# Patient Record
Sex: Female | Born: 1935 | Race: White | Hispanic: No | Smoking: Never smoker
Health system: Southern US, Community
[De-identification: ages and names within clinical notes are randomized; demographics above are authoritative.]

## PROBLEM LIST (undated history)

## (undated) DIAGNOSIS — I38 Endocarditis, valve unspecified: Secondary | ICD-10-CM

## (undated) DIAGNOSIS — E7800 Pure hypercholesterolemia, unspecified: Secondary | ICD-10-CM

## (undated) DIAGNOSIS — C801 Malignant (primary) neoplasm, unspecified: Secondary | ICD-10-CM

## (undated) DIAGNOSIS — I509 Heart failure, unspecified: Secondary | ICD-10-CM

## (undated) DIAGNOSIS — I10 Essential (primary) hypertension: Secondary | ICD-10-CM

---

## 2004-12-01 ENCOUNTER — Ambulatory Visit: Payer: Self-pay | Admitting: Ophthalmology

## 2004-12-08 ENCOUNTER — Ambulatory Visit: Payer: Self-pay | Admitting: Ophthalmology

## 2006-12-21 ENCOUNTER — Ambulatory Visit: Payer: Self-pay | Admitting: Nurse Practitioner

## 2007-07-12 ENCOUNTER — Emergency Department: Payer: Self-pay | Admitting: Emergency Medicine

## 2008-02-05 ENCOUNTER — Ambulatory Visit: Payer: Self-pay | Admitting: Family Medicine

## 2008-10-02 ENCOUNTER — Ambulatory Visit: Payer: Self-pay | Admitting: Family Medicine

## 2009-04-30 ENCOUNTER — Ambulatory Visit: Payer: Self-pay | Admitting: Family Medicine

## 2009-05-14 ENCOUNTER — Ambulatory Visit: Payer: Self-pay | Admitting: Family Medicine

## 2009-05-19 ENCOUNTER — Ambulatory Visit: Payer: Self-pay | Admitting: Oncology

## 2009-06-03 ENCOUNTER — Ambulatory Visit: Payer: Self-pay | Admitting: Oncology

## 2009-06-12 ENCOUNTER — Ambulatory Visit: Payer: Self-pay | Admitting: Family Medicine

## 2009-06-18 ENCOUNTER — Ambulatory Visit: Payer: Self-pay | Admitting: Cardiothoracic Surgery

## 2009-06-24 ENCOUNTER — Ambulatory Visit: Payer: Self-pay | Admitting: Oncology

## 2009-07-08 ENCOUNTER — Ambulatory Visit: Payer: Self-pay | Admitting: Oncology

## 2009-07-19 ENCOUNTER — Ambulatory Visit: Payer: Self-pay | Admitting: Oncology

## 2009-07-19 ENCOUNTER — Ambulatory Visit: Payer: Self-pay | Admitting: Cardiothoracic Surgery

## 2009-09-18 ENCOUNTER — Ambulatory Visit: Payer: Self-pay | Admitting: Oncology

## 2009-10-07 ENCOUNTER — Ambulatory Visit: Payer: Self-pay | Admitting: Oncology

## 2009-10-09 ENCOUNTER — Ambulatory Visit: Payer: Self-pay | Admitting: Oncology

## 2009-10-19 ENCOUNTER — Ambulatory Visit: Payer: Self-pay | Admitting: Oncology

## 2009-10-29 ENCOUNTER — Ambulatory Visit: Payer: Self-pay | Admitting: Oncology

## 2010-04-18 ENCOUNTER — Ambulatory Visit: Payer: Self-pay | Admitting: Oncology

## 2010-04-23 ENCOUNTER — Ambulatory Visit: Payer: Self-pay | Admitting: Oncology

## 2010-05-19 ENCOUNTER — Ambulatory Visit: Payer: Self-pay | Admitting: Oncology

## 2010-10-25 ENCOUNTER — Ambulatory Visit: Payer: Self-pay | Admitting: Oncology

## 2010-10-26 ENCOUNTER — Ambulatory Visit: Payer: Self-pay | Admitting: Oncology

## 2010-11-18 ENCOUNTER — Ambulatory Visit: Payer: Self-pay | Admitting: Oncology

## 2011-08-08 ENCOUNTER — Emergency Department: Payer: Self-pay | Admitting: Emergency Medicine

## 2014-07-15 ENCOUNTER — Emergency Department: Payer: Self-pay | Admitting: Emergency Medicine

## 2014-07-15 LAB — CBC WITH DIFFERENTIAL/PLATELET
BASOS PCT: 0.8 %
Basophil #: 0 10*3/uL (ref 0.0–0.1)
EOS ABS: 0.2 10*3/uL (ref 0.0–0.7)
Eosinophil %: 4.3 %
HCT: 39.9 % (ref 35.0–47.0)
HGB: 13.2 g/dL (ref 12.0–16.0)
LYMPHS ABS: 1 10*3/uL (ref 1.0–3.6)
LYMPHS PCT: 27 %
MCH: 33.8 pg (ref 26.0–34.0)
MCHC: 33 g/dL (ref 32.0–36.0)
MCV: 103 fL — AB (ref 80–100)
Monocyte #: 0.6 x10 3/mm (ref 0.2–0.9)
Monocyte %: 16 %
NEUTROS PCT: 51.9 %
Neutrophil #: 1.9 10*3/uL (ref 1.4–6.5)
PLATELETS: 185 10*3/uL (ref 150–440)
RBC: 3.89 10*6/uL (ref 3.80–5.20)
RDW: 13.4 % (ref 11.5–14.5)
WBC: 3.8 10*3/uL (ref 3.6–11.0)

## 2014-07-15 LAB — COMPREHENSIVE METABOLIC PANEL
ALBUMIN: 4.1 g/dL (ref 3.4–5.0)
ANION GAP: 8 (ref 7–16)
Alkaline Phosphatase: 85 U/L
BUN: 17 mg/dL (ref 7–18)
Bilirubin,Total: 0.5 mg/dL (ref 0.2–1.0)
CHLORIDE: 101 mmol/L (ref 98–107)
CO2: 24 mmol/L (ref 21–32)
CREATININE: 1.13 mg/dL (ref 0.60–1.30)
Calcium, Total: 9.3 mg/dL (ref 8.5–10.1)
EGFR (Non-African Amer.): 43 — ABNORMAL LOW
GFR CALC AF AMER: 50 — AB
Glucose: 103 mg/dL — ABNORMAL HIGH (ref 65–99)
Osmolality: 268 (ref 275–301)
Potassium: 4.3 mmol/L (ref 3.5–5.1)
SGOT(AST): 28 U/L (ref 15–37)
SGPT (ALT): 24 U/L
Sodium: 133 mmol/L — ABNORMAL LOW (ref 136–145)
Total Protein: 7.2 g/dL (ref 6.4–8.2)

## 2014-11-25 DIAGNOSIS — I119 Hypertensive heart disease without heart failure: Secondary | ICD-10-CM | POA: Insufficient documentation

## 2014-11-25 DIAGNOSIS — I340 Nonrheumatic mitral (valve) insufficiency: Secondary | ICD-10-CM | POA: Insufficient documentation

## 2014-12-16 DIAGNOSIS — I350 Nonrheumatic aortic (valve) stenosis: Secondary | ICD-10-CM | POA: Insufficient documentation

## 2014-12-16 DIAGNOSIS — I071 Rheumatic tricuspid insufficiency: Secondary | ICD-10-CM | POA: Insufficient documentation

## 2015-01-05 ENCOUNTER — Inpatient Hospital Stay: Payer: Self-pay | Admitting: Surgery

## 2015-01-05 LAB — CBC WITH DIFFERENTIAL/PLATELET
BASOS ABS: 0.1 10*3/uL (ref 0.0–0.1)
Basophil %: 0.6 %
EOS ABS: 0 10*3/uL (ref 0.0–0.7)
Eosinophil %: 0.1 %
HCT: 47.2 % — ABNORMAL HIGH (ref 35.0–47.0)
HGB: 15.3 g/dL (ref 12.0–16.0)
Lymphocyte #: 0.6 10*3/uL — ABNORMAL LOW (ref 1.0–3.6)
Lymphocyte %: 4.1 %
MCH: 33.1 pg (ref 26.0–34.0)
MCHC: 32.4 g/dL (ref 32.0–36.0)
MCV: 102 fL — ABNORMAL HIGH (ref 80–100)
Monocyte #: 0.5 x10 3/mm (ref 0.2–0.9)
Monocyte %: 3.8 %
NEUTROS ABS: 12.6 10*3/uL — AB (ref 1.4–6.5)
Neutrophil %: 91.4 %
PLATELETS: 227 10*3/uL (ref 150–440)
RBC: 4.62 10*6/uL (ref 3.80–5.20)
RDW: 13.8 % (ref 11.5–14.5)
WBC: 13.8 10*3/uL — AB (ref 3.6–11.0)

## 2015-01-05 LAB — COMPREHENSIVE METABOLIC PANEL
ALK PHOS: 87 U/L
ANION GAP: 8 (ref 7–16)
Albumin: 4 g/dL (ref 3.4–5.0)
BILIRUBIN TOTAL: 0.4 mg/dL (ref 0.2–1.0)
BUN: 17 mg/dL (ref 7–18)
CALCIUM: 9.7 mg/dL (ref 8.5–10.1)
CREATININE: 0.93 mg/dL (ref 0.60–1.30)
Chloride: 100 mmol/L (ref 98–107)
Co2: 27 mmol/L (ref 21–32)
EGFR (African American): >60
EGFR (Non-African Amer.): >60
GLUCOSE: 247 mg/dL — AB (ref 65–99)
OSMOLALITY: 280 (ref 275–301)
POTASSIUM: 4.3 mmol/L (ref 3.5–5.1)
SGOT(AST): 24 U/L (ref 15–37)
SGPT (ALT): 21 U/L
SODIUM: 135 mmol/L — AB (ref 136–145)
TOTAL PROTEIN: 7.5 g/dL (ref 6.4–8.2)

## 2015-01-05 LAB — URINALYSIS, COMPLETE
BACTERIA: NONE SEEN
Bilirubin,UR: NEGATIVE
Blood: NEGATIVE
Hyaline Cast: 20
LEUKOCYTE ESTERASE: NEGATIVE
NITRITE: NEGATIVE
Ph: 5 (ref 4.5–8.0)
Protein: 100
SPECIFIC GRAVITY: 1.023 (ref 1.003–1.030)
Squamous Epithelial: NONE SEEN

## 2015-01-05 LAB — LIPASE, BLOOD: Lipase: 65 U/L — ABNORMAL LOW (ref 73–393)

## 2015-01-06 LAB — COMPREHENSIVE METABOLIC PANEL
ALBUMIN: 3.1 g/dL — AB (ref 3.4–5.0)
ALK PHOS: 66 U/L
ALT: 16 U/L
Anion Gap: 5 — ABNORMAL LOW (ref 7–16)
BILIRUBIN TOTAL: 0.4 mg/dL (ref 0.2–1.0)
BUN: 20 mg/dL — AB (ref 7–18)
Calcium, Total: 8.4 mg/dL — ABNORMAL LOW (ref 8.5–10.1)
Chloride: 105 mmol/L (ref 98–107)
Co2: 29 mmol/L (ref 21–32)
Creatinine: 0.99 mg/dL (ref 0.60–1.30)
EGFR (African American): >60
EGFR (Non-African Amer.): 56 — ABNORMAL LOW
Glucose: 167 mg/dL — ABNORMAL HIGH (ref 65–99)
OSMOLALITY: 284 (ref 275–301)
Potassium: 4.7 mmol/L (ref 3.5–5.1)
SGOT(AST): 18 U/L (ref 15–37)
SODIUM: 139 mmol/L (ref 136–145)
TOTAL PROTEIN: 6.1 g/dL — AB (ref 6.4–8.2)

## 2015-01-06 LAB — PROTIME-INR
INR: 1.1
Prothrombin Time: 14.4 secs (ref 11.5–14.7)

## 2015-01-06 LAB — LIPASE, BLOOD: LIPASE: 40 U/L — AB (ref 73–393)

## 2015-01-06 LAB — AMYLASE: Amylase: 32 U/L (ref 25–115)

## 2015-01-06 LAB — APTT: Activated PTT: 32.6 secs (ref 23.6–35.9)

## 2015-01-06 LAB — CBC WITH DIFFERENTIAL/PLATELET
Bands: 8 %
HCT: 45.3 % (ref 35.0–47.0)
HGB: 15.1 g/dL (ref 12.0–16.0)
LYMPHS PCT: 11 %
MCH: 33.9 pg (ref 26.0–34.0)
MCHC: 33.2 g/dL (ref 32.0–36.0)
MCV: 102 fL — ABNORMAL HIGH (ref 80–100)
Monocytes: 12 %
PLATELETS: 213 10*3/uL (ref 150–440)
RBC: 4.44 10*6/uL (ref 3.80–5.20)
RDW: 14 % (ref 11.5–14.5)
Segmented Neutrophils: 69 %
WBC: 8.2 10*3/uL (ref 3.6–11.0)

## 2015-01-06 LAB — MAGNESIUM: MAGNESIUM: 2.2 mg/dL

## 2015-01-10 LAB — COMPREHENSIVE METABOLIC PANEL
ALT: 18 U/L
ANION GAP: 6 — AB (ref 7–16)
Albumin: 2.6 g/dL — ABNORMAL LOW (ref 3.4–5.0)
Alkaline Phosphatase: 54 U/L
BILIRUBIN TOTAL: 0.6 mg/dL (ref 0.2–1.0)
BUN: 7 mg/dL (ref 7–18)
CALCIUM: 8.4 mg/dL — AB (ref 8.5–10.1)
CO2: 36 mmol/L — AB (ref 21–32)
CREATININE: 0.57 mg/dL — AB (ref 0.60–1.30)
Chloride: 97 mmol/L — ABNORMAL LOW (ref 98–107)
EGFR (African American): >60
GLUCOSE: 116 mg/dL — AB (ref 65–99)
OSMOLALITY: 276 (ref 275–301)
Potassium: 3.6 mmol/L (ref 3.5–5.1)
SGOT(AST): 23 U/L (ref 15–37)
SODIUM: 139 mmol/L (ref 136–145)
Total Protein: 5.7 g/dL — ABNORMAL LOW (ref 6.4–8.2)

## 2015-01-10 LAB — CBC WITH DIFFERENTIAL/PLATELET
BASOS PCT: 1.4 %
Basophil #: 0.1 10*3/uL (ref 0.0–0.1)
EOS ABS: 0.1 10*3/uL (ref 0.0–0.7)
Eosinophil %: 1.9 %
HCT: 36.3 % (ref 35.0–47.0)
HGB: 11.9 g/dL — AB (ref 12.0–16.0)
LYMPHS PCT: 17.1 %
Lymphocyte #: 0.8 10*3/uL — ABNORMAL LOW (ref 1.0–3.6)
MCH: 33.5 pg (ref 26.0–34.0)
MCHC: 32.8 g/dL (ref 32.0–36.0)
MCV: 102 fL — ABNORMAL HIGH (ref 80–100)
MONOS PCT: 13.7 %
Monocyte #: 0.7 x10 3/mm (ref 0.2–0.9)
NEUTROS ABS: 3.2 10*3/uL (ref 1.4–6.5)
Neutrophil %: 65.9 %
Platelet: 138 10*3/uL — ABNORMAL LOW (ref 150–440)
RBC: 3.56 10*6/uL — AB (ref 3.80–5.20)
RDW: 13.4 % (ref 11.5–14.5)
WBC: 4.9 10*3/uL (ref 3.6–11.0)

## 2015-01-15 ENCOUNTER — Encounter: Payer: Self-pay | Admitting: Internal Medicine

## 2015-01-19 ENCOUNTER — Encounter: Payer: Self-pay | Admitting: Internal Medicine

## 2015-02-17 ENCOUNTER — Encounter
Admit: 2015-02-17 | Disposition: A | Discharge status: Home / Self Care | Payer: Self-pay | Attending: Internal Medicine | Admitting: Internal Medicine

## 2015-03-20 ENCOUNTER — Encounter
Admit: 2015-03-20 | Disposition: A | Discharge status: Home / Self Care | Payer: Self-pay | Attending: Internal Medicine | Admitting: Internal Medicine

## 2015-04-19 NOTE — Discharge Summary (Signed)
PATIENT NAME:  Rhonda Mcclure, Rhonda Mcclure MR#:  [id number] DATE OF BIRTH:  Oct 06, 1923  DATE OF ADMISSION:  01/05/2015 DATE OF DISCHARGE:  01/14/2015  DIAGNOSES: Small bowel obstruction, emphysema, chronic obstructive pulmonary disease, history of cataracts, hypertension, coronary artery disease, history of clots, history of melanoma, hyperlipidemia, hypertension and hysterectomy in the past.   CONSULTANTS: Cardiology.   PROCEDURES: Exploratory laparotomy and adhesiolysis.   HISTORY OF PRESENT ILLNESS AND HOSPITAL COURSE: This is a patient who was admitted the hospital by Dr. Marina Gravel and Dr. Leanora Cover. He underwent an exploratory laparotomy after conservative management failed to notice any progression. An adhesiolysis was performed. Postoperatively, she did well. Was advanced to a regular diet and is transferred to a skilled nursing facility, at this point, to follow up in our office in 10 days.   DISCHARGE MEDICATIONS: Are delineated in the discharge paperwork, as well as  the medical reconciliation.   DISCHARGE INSTRUCTIONS: She may shower at this point and staples will be removed in 10 days.     ____________________________ Jerrol Banana Burt Knack, MD rec:JT D: 01/14/2015 12:11:29 ET T: 01/14/2015 12:41:20 ET JOB#: [id number]  cc: Jerrol Banana. Burt Knack, MD, <Dictator> Florene Glen MD ELECTRONICALLY SIGNED 01/15/2015 9:07

## 2015-04-19 NOTE — H&P (Signed)
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 She denies fever, but has had chills today. She had some nausea, and vomitied some mucus this AM. Her last BM was Saturday, 2 days ago (normal). and she typically goes daily. SHe has taken MOM, an enema, and a glycerin suppository, without effect. She has a baseline protuberant abdomen, but she feels a little more bloated than usual.  Long discussion re: EOL wishes and she consents to dialysis, Code, inotropes, blood, surgery, ostomy, feeding tubes, and mechanical ventilation, as long as she has a reasonable chance of recovery. If not, she would not like prolonged ventilation and refuses tracheostomy.   Past History Additional ROS- no PND, DOE, wheezing, GU Sx, hematemesis, hematocheia, melena, neurologic Sx.   Code Status Full Code   Healthcare POA 2 of her 4 children   Past Med/Surgical Hx:  basal cell carcinoma of nose:   hemorroids:   HEART MURMUR:   EMPHYSEMA:   COPD:   cataracts:   hypertension:   heart disease:   blood clots:   melanoma:   Hyperlipidemia:   Hypertension:   Hysterectomy - Partial:   ALLERGIES:  PCN: Rash  Morphine: Rash  Sulfa drugs: Unknown  HOME MEDICATIONS: Medication Instructions Status  aspirin 81 mg oral tablet 1 tab(s) orally once a day (at bedtime) Active  amLODIPine 5 mg oral tablet 1 tab(s) orally once a day (in the morning) Active  losartan 100 mg oral tablet 1 tab(s) orally once a day (in the morning) Active  hydrALAZINE 25 mg oral tablet 1 tab(s) orally 3 times a day Active  enalapril 20 mg oral tablet 1 tab(s) orally 2 times a day Active  simvastatin 20 mg oral tablet 1 tab(s) orally once a day (at bedtime) Active  Vitamin D2 50,000 intl units oral capsule 1 cap(s) orally once a month Active  Qvar 40 mcg/inh inhalation aerosol 1 puff(s) inhaled 2 times a day Active   Combivent Respimat CFC free 100 mcg-20 mcg/inh inhalation aerosol 1 puff(s) inhaled 4 times a day, As Needed - for Shortness of Breath Active   Family and Social History:  Family History father died of cardiovascular disease and mother had adenocarcinoma and a Sister Mary Joseph's node, presumably due to GYN malignancy.   Social History positive  tobacco, negative ETOH, negative Illicit drugs, retired Therapist, sports, lives alone, 4 children live close by, smoked < 1 PPD from teenage years to 1989, drinks ETOH rarely   + Tobacco Prior (greater than 1 year)   Place of Living Home   Review of Systems:  Fever/Chills Yes   Cough No   Sputum No   Abdominal Pain abdominal pressure   Diarrhea No   Constipation No   Nausea/Vomiting Yes   SOB/DOE No   Chest Pain No   Dysuria No   Tolerating PT Yes   Tolerating Diet No  Nauseated  Vomiting  vomited CT contrast   Medications/Allergies Reviewed Medications/Allergies reviewed   Physical Exam:  GEN well developed, well nourished, no acute distress, elderly   HEENT pink conjunctivae, PERRL, hearing intact to voice, moist oral mucosa, Oropharynx clear   NECK supple  trachea midline   RESP normal resp effort  clear BS  no use of accessory muscles   CARD regular rate  murmur present  no thrills  no JVD  no Rub   ABD denies tenderness  soft, mildly distended, nontender, nondistended   EXTR negative cyanosis/clubbing, negative edema   SKIN normal to palpation, No ulcers, skin turgor good   NEURO cranial nerves intact, negative tremor, follows commands, motor/sensory function intact   PSYCH alert, A+O to time, place, person, good insight   Additional Comments I personally viewed the CT scan.   Lab Results: Hepatic:  18-Jan-16 12:20   Bilirubin, Total 0.4  Alkaline Phosphatase 87 (46-116 NOTE: New Reference Range 07/08/14)  SGPT (ALT) 21 (14-63 NOTE: New Reference Range 07/08/14)  SGOT (AST) 24  Total Protein, Serum 7.5   Albumin, Serum 4.0  Routine Chem:  18-Jan-16 12:20   Glucose, Serum  247  BUN 17  Creatinine (comp) 0.93  Sodium, Serum  135  Potassium, Serum 4.3  Chloride, Serum 100  CO2, Serum 27  Calcium (Total), Serum 9.7  Osmolality (calc) 280  eGFR (African American) >60  eGFR (Non-African American) >60 (eGFR values <48m/min/1.73 m2 may be an indication of chronic kidney disease (CKD). Calculated eGFR, using the MRDR Study equation, is useful in  patients with stable renal function. The eGFR calculation will not be reliable in acutely ill patients when serum creatinine is changing rapidly. It is not useful in patients on dialysis. The eGFR calculation may not be applicable to patients at the low and high extremes of body sizes, pregnant women, and vegetarians.)  Anion Gap 8  Lipase  65 (Result(s) reported on 05 Jan 2015 at 01:02PM.)  Routine UA:  18-Jan-16 13:50   Color (UA) Amber  Clarity (UA) Clear  Glucose (UA) 50 mg/dL  Bilirubin (UA) Negative  Ketones (UA) Trace  Specific Gravity (UA) 1.023  Blood (UA) Negative  pH (UA) 5.0  Protein (UA) 100 mg/dL  Nitrite (UA) Negative  Leukocyte Esterase (UA) Negative (Result(s) reported on 05 Jan 2015 at 02:17PM.)  RBC (UA) 1 /HPF  WBC (UA) 2 /HPF  Bacteria (UA) NONE SEEN  Epithelial Cells (UA) NONE SEEN  Mucous (UA) PRESENT  Hyaline Cast (UA) 20 /LPF (Result(s) reported on 05 Jan 2015 at 02:17PM.)  Routine Hem:  18-Jan-16 12:20   WBC (CBC)  13.8  RBC (CBC) 4.62  Hemoglobin (CBC) 15.3  Hematocrit (CBC)  47.2  Platelet Count (CBC) 227  MCV  102  MCH 33.1  MCHC 32.4  RDW 13.8  Neutrophil % 91.4  Lymphocyte % 4.1  Monocyte % 3.8  Eosinophil % 0.1  Basophil % 0.6  Neutrophil #  12.6  Lymphocyte #  0.6  Monocyte # 0.5  Eosinophil # 0.0  Basophil # 0.1 (Result(s) reported on 05 Jan 2015 at 12:51PM.)   Radiology Results: LabUnknown:    18-Jan-16 14:59, CT Abdomen and Pelvis With Contrast  PACS Image  CT:  CT Abdomen  and Pelvis With Contrast  REASON FOR EXAM:    (1) llq pain and abd distension; (2) llq pain and abd   distension  COMMENTS:       PROCEDURE: CT  - CT ABDOMEN / PELVIS  W  - Jan 05 2015  2:59PM     CLINICAL DATA:  Initial encounter for left lower quadrant pain with  nausea. Hysterectomy. Appendectomy. Melanoma.    EXAM:  CT ABDOMEN AND PELVIS WITH CONTRAST    TECHNIQUE:  Multidetector CT imaging of the abdomen and pelvis was performed  using the standard protocol following bolus administration of  intravenous contrast.    COMPARISON:  PET of 06/12/2009.  No prior abdominal pelvic CT.    FINDINGS:  Lower chest:  Centrilobular emphysema. Mild cardiomegaly with  coronary artery atherosclerosis. Small hiatal hernia.    Hepatobiliary: Focal steatosis adjacent the falciform ligament.  Normal gallbladder, without biliary ductal dilatation.    Pancreas: Normal pancreas for age, without ductal dilatation or  acute pancreatitis.    Spleen: Calcifications within suggest old granulomatous disease.  Adrenals/Urinary Tract: Normal adrenal glands. Upper pole left renal  lesion is too small to characterize but likely a cyst. Normal right  kidney, without hydronephrosis. Decompressed urinary bladder.    Stomach/Bowel: Mild gastric distension. decompressed distal colon,  with diverticula within. Distal small bowel loops are normal in  caliber.    Proximal and mid small bowel loops are moderately distended. Mid  small bowel loops within the pelvis demonstrates mesenteric edema  and interloop fluid. Example image 60 of series 2. There is a  suggestion of mass effect upon bowel loops and subtending mesentery  supplying this area, including on coronal image 58. Also transverse  images 54 through 63. Mass effect upon bowel loop within this region  including on images 50 through 53 transverse. No bowel wall  thickening or pneumatosis identified. No extraluminal gas    Vascular/Lymphatic: Aortic  and branch vessel atherosclerosis. Non  aneurysmal dilatation of the infrarenal aorta at 2.3 cm. No  abdominopelvic adenopathy.    Reproductive: Hysterectomy.  No well-defined adnexal mass.    Other: Small volume perihepatic ascites and cul-de-sac fluid. No  evidence of omental or peritoneal disease.    Musculoskeletal: Mild superior endplate compression deformity versus  Schmorl's node at L2. Trace L4-5 anterolisthesis which is likely  degenerative.   IMPRESSION:  1. Partial small bowel obstruction. Appearance of mid small bowel  loops within the pelvis is suspicious for internal hernia and/or  closed loop obstruction. Surgical consultation should be considered.  2. Small volume abdominal pelvic ascites, likely secondary.  3.  Atherosclerosis, including within the coronary arteries.  4. Gastric distention. The patient may benefit from nasogastric tube  placement.      Electronically Signed    By: Abigail Miyamoto M.D.    On: 01/05/2015 15:42       Verified By: Areta Haber, M.D.,    Assessment/Admission Diagnosis PSBO, possibly due to threatened intestine (closed loop, distal mesenteric mass). Patient and family well understand the risks of negative laparotomy as well as the risk of waiting, if indeed, she has threatened bowel.   Plan Admit, IVF, NG suction. Repeat physical exams, possibly tonight by my partner, and certainly in AM by me, as well as repeat Abdominal XRays, CBC, CMP, Amylase, and serum lactate level in AM.  [75 minutes total time in patient care, 40 minutes of which was face-to-face, spent in patient and family education].   Electronic Signatures: Consuela Mimes (MD)  (Signed 18-Jan-16 17:21)  Authored: CHIEF COMPLAINT and HISTORY, PAST MEDICAL/SURGIAL HISTORY, ALLERGIES, HOME MEDICATIONS, FAMILY AND SOCIAL HISTORY, REVIEW OF SYSTEMS, PHYSICAL EXAM, LABS, Radiology, ASSESSMENT AND PLAN   Last Updated: 18-Jan-16 17:21 by Consuela Mimes (MD)

## 2015-04-19 NOTE — Op Note (Signed)
PATIENT NAME:  Rhonda Mcclure, Rhonda Mcclure MR#:  [id number] DATE OF BIRTH:  [birth year]  DATE OF PROCEDURE:  01/07/2015  OPERATION PERFORMED: Laparotomy, adhesiolysis.   SURGEON: Consuela Mimes.   PREOPERATIVE DIAGNOSIS: Small bowel obstruction.  POSTOPERATIVE DIAGNOSIS:  Small bowel obstruction due to single adhesion.   ANESTHESIA: General.   PROCEDURE IN DETAIL: The patient was placed supine on the Operating Room table and prepped and draped in the usual sterile fashion. A limited midline incision was made and carried down through the linea alba and the peritoneum was entered carefully. There was a few 100 mL of cloudy ascites present.  There was obvious distended and distressed small intestine, as well as decompressed normal appearing small intestine and I was able to find a single adhesion in the retroperitoneum just above the sacral promontory through which the intestine had gone. I lysed this with the electrocautery and the intestinal tract was freed up, and I ran the small intestine from the ligament of Treitz to the terminal ileum and there were 3 or 4 loops or a fairly long segment of proximal to mid ileum that had been caught under the adhesion. I found both constricted points and neither of these had any evidence of necrosis or permanent stricture and in between the ileum appeared hemorrhagic due to venous congestion, but it was quite viable. Therefore, I replaced the small intestine in its anatomic position and then freed up some adhesions from the omentum to the right lateral abdominal side wall so that I could drape the transverse colon and omentum over the intestine. I then closed the linea alba with a running #1 PDS suture and the skin with a skin stapling device and applied a sterile dressing completing the procedure. The patient tolerated the procedure well and there were no complications.      ____________________________ Consuela Mimes, MD wfm:at D: 01/07/2015 08:39:31  ET T: 01/07/2015 08:59:04 ET JOB#: [id number]  cc: Consuela Mimes, MD, <Dictator> Consuela Mimes MD ELECTRONICALLY SIGNED 01/09/2015 13:54

## 2015-04-19 NOTE — Consult Note (Signed)
PATIENT NAME:  SEPTEMBER, MORMILE MR#:  [id number] DATE OF BIRTH:  [birth year]  DATE OF CONSULTATION:  01/06/2015  REFERRING PHYSICIAN:  Consuela Mimes, MD  CONSULTING PHYSICIAN:  Isaias Cowman, MD  PRIMARY CARE PHYSICIAN: Clayborn Bigness, MD   CHIEF COMPLAINT: Abdominal pain.   REASON FOR CONSULTATION: Consultation requested for preoperative cardiovascular evaluation.   HISTORY OF PRESENT ILLNESS: The patient is a 81 year old female with a history of hypertension and aortic stenosis, who was admitted with acute onset of left lower quadrant pain with nausea and vomiting. The patient has a partial small bowel obstruction and is scheduled for laparotomy. She currently denies chest pain or shortness of breath. The patient has a history of moderate aortic stenosis without history of syncope.   PAST MEDICAL HISTORY: 1. Moderate nonrheumatic aortic valve stenosis with calculated aortic valve area of 1.4 cm2.   2. Hypertension.  3. Hyperlipidemia.   MEDICATIONS ON ADMISSION: Aspirin 81 mg daily, amlodipine 5 mg daily, enalapril 20 mg daily, hydralazine 20 mg t.i.d., losartan 100 mg daily, simvastatin 20 mg at bedtime beclomethasone dipropionate inhaler b.i.d., ergocalciferol vitamin D 50,000 units monthly, Combivent inhaler b.i.d.   SOCIAL HISTORY: The patient currently lives alone. The patient stopped smoking approximately 26 years ago.   FAMILY HISTORY: Positive for myocardial infarction.   REVIEW OF SYSTEMS:  CONSTITUTIONAL: No fever or chills.  EYES: No blurry vision.  EARS: No hearing loss.  RESPIRATORY: No shortness of breath.  CARDIOVASCULAR: No chest pain.  GASTROINTESTINAL: The patient has left lower quadrant pain with nausea and vomiting.  GENITOURINARY: No dysuria or hematuria.  ENDOCRINE: No polyuria or polydipsia.  MUSCULOSKELETAL: No arthralgias or myalgias.  NEUROLOGICAL: No focal muscle weakness or numbness.  PSYCHOLOGICAL: No depression or anxiety.   PHYSICAL  EXAMINATION: VITAL SIGNS: Blood pressure 128/74, pulse 80, respirations 18, temperature 97.8, pulse oximetry 91%.  HEENT: Pupils equal, reactive to light and accommodation.  NECK: Supple without thyromegaly.  LUNGS: Clear.  HEART: Normal JVP. Normal PMI. Regular rate and rhythm. Normal S1, S2. Grade 2/6 mid peaking systolic murmur.  ABDOMEN: Soft and nontender. Pulses were intact bilaterally.  MUSCULOSKELETAL: Normal muscle tone.  NEUROLOGIC: The patient is alert and oriented x3. Motor and sensory both grossly intact.   IMPRESSION: A 81 year old female with moderate aortic stenosis, otherwise no serious cardiovascular risk factors. The patient denies prior history of myocardial infarction, congestive heart failure, chronic kidney disease, or stroke. The patient would be at low and acceptable risk for surgery.   RECOMMENDATIONS: 1. Agree with overall current therapy.  2. Proceed with surgery without further cardiac diagnostics at this time.  3. Continue current medications for blood pressure control.    ____________________________ Isaias Cowman, MD ap:mw D: 01/06/2015 13:23:43 ET T: 01/06/2015 13:46:44 ET JOB#: [id number]  cc: Isaias Cowman, MD, <Dictator> Isaias Cowman MD ELECTRONICALLY SIGNED 01/07/2015 18:07

## 2015-06-29 DIAGNOSIS — I872 Venous insufficiency (chronic) (peripheral): Secondary | ICD-10-CM | POA: Insufficient documentation

## 2017-01-01 ENCOUNTER — Emergency Department
Admission: EM | Admit: 2017-01-01 | Discharge: 2017-01-01 | Disposition: A | Discharge status: Home / Self Care | Payer: Medicare Other | Attending: Emergency Medicine | Admitting: Emergency Medicine

## 2017-01-01 ENCOUNTER — Emergency Department: Payer: Medicare Other

## 2017-01-01 DIAGNOSIS — I110 Hypertensive heart disease with heart failure: Secondary | ICD-10-CM | POA: Insufficient documentation

## 2017-01-01 DIAGNOSIS — L03115 Cellulitis of right lower limb: Secondary | ICD-10-CM | POA: Insufficient documentation

## 2017-01-01 DIAGNOSIS — Z79899 Other long term (current) drug therapy: Secondary | ICD-10-CM | POA: Diagnosis not present

## 2017-01-01 DIAGNOSIS — M7989 Other specified soft tissue disorders: Secondary | ICD-10-CM | POA: Diagnosis present

## 2017-01-01 DIAGNOSIS — I509 Heart failure, unspecified: Secondary | ICD-10-CM | POA: Insufficient documentation

## 2017-01-01 HISTORY — DX: Essential (primary) hypertension: I10

## 2017-01-01 HISTORY — DX: Heart failure, unspecified: I50.9

## 2017-01-01 HISTORY — DX: Endocarditis, valve unspecified: I38

## 2017-01-01 HISTORY — DX: Pure hypercholesterolemia, unspecified: E78.00

## 2017-01-01 LAB — CBC WITH DIFFERENTIAL/PLATELET
Basophils Absolute: 0.1 10*3/uL (ref 0–0.1)
Basophils Relative: 1 %
Eosinophils Absolute: 0 10*3/uL (ref 0–0.7)
Eosinophils Relative: 1 %
HEMATOCRIT: 35.5 % (ref 35.0–47.0)
HEMOGLOBIN: 12.2 g/dL (ref 12.0–16.0)
LYMPHS ABS: 1.2 10*3/uL (ref 1.0–3.6)
LYMPHS PCT: 20 %
MCH: 35.1 pg — AB (ref 26.0–34.0)
MCHC: 34.3 g/dL (ref 32.0–36.0)
MCV: 102.4 fL — AB (ref 80.0–100.0)
MONOS PCT: 20 %
Monocytes Absolute: 1.2 10*3/uL — ABNORMAL HIGH (ref 0.2–0.9)
NEUTROS ABS: 3.5 10*3/uL (ref 1.4–6.5)
NEUTROS PCT: 58 %
Platelets: 248 10*3/uL (ref 150–440)
RBC: 3.46 MIL/uL — ABNORMAL LOW (ref 3.80–5.20)
RDW: 16 % — ABNORMAL HIGH (ref 11.5–14.5)
WBC: 6.1 10*3/uL (ref 3.6–11.0)

## 2017-01-01 LAB — COMPREHENSIVE METABOLIC PANEL
ALT: 14 U/L (ref 14–54)
AST: 23 U/L (ref 15–41)
Albumin: 4 g/dL (ref 3.5–5.0)
Alkaline Phosphatase: 77 U/L (ref 38–126)
Anion gap: 7 (ref 5–15)
BUN: 12 mg/dL (ref 6–20)
CHLORIDE: 101 mmol/L (ref 101–111)
CO2: 31 mmol/L (ref 22–32)
CREATININE: 0.74 mg/dL (ref 0.44–1.00)
Calcium: 9.9 mg/dL (ref 8.9–10.3)
GFR calc Af Amer: >60 mL/min (ref >60)
GFR calc non Af Amer: >60 mL/min (ref >60)
Glucose, Bld: 115 mg/dL — ABNORMAL HIGH (ref 65–99)
POTASSIUM: 3.6 mmol/L (ref 3.5–5.1)
SODIUM: 139 mmol/L (ref 135–145)
Total Bilirubin: 0.5 mg/dL (ref 0.3–1.2)
Total Protein: 7.3 g/dL (ref 6.5–8.1)

## 2017-01-01 MED ORDER — DOXYCYCLINE HYCLATE 100 MG PO TABS: 100.0000 mg | ORAL_TABLET | Freq: Two times a day (BID) | ORAL | 0 refills | Status: DC

## 2017-01-01 MED ORDER — VANCOMYCIN HCL 10 G IV SOLR
1250.0000 mg | Freq: Once | INTRAVENOUS | Status: AC
  Administered 2017-01-01: 1250 mg via INTRAVENOUS
  Filled 2017-01-01: qty 1250

## 2017-01-01 MED ORDER — DOXYCYCLINE HYCLATE 100 MG PO CAPS: 100.0000 mg | ORAL_CAPSULE | Freq: Two times a day (BID) | ORAL | 0 refills | Status: DC

## 2017-01-01 NOTE — ED Triage Notes (Signed)
Right lower leg swelling and redness X 1 week. Pt denies injury. No break in skin integrity. Redness and swelling beginning at ankle and moving up to mid shin.

## 2017-01-01 NOTE — ED Provider Notes (Signed)
[facility] Emergency Department Provider Note  ____________________________________________   First MD Initiated Contact with Patient 01/01/17 1529     (approximate)  I have reviewed the triage vital signs and the nursing notes.   HISTORY  Chief Complaint Leg Swelling   HPI HAFEEZAH BLEAK is a 83 y.o. female with a history of CHF, heart felt disorder on eliquis was present the emergency department with right lower summary swelling over the past one half weeks. She says that she is experiencing pain over the lateral right ankle. However, she denies any injury. She says that the swelling has increased along with redness. Denies any fever. Does have a history of cellulitis.   Past Medical History:  Diagnosis Date  . CHF (congestive heart failure) ([place])   . Heart valve disorder   . Hypercholesteremia   . Hypertension     There are no active problems to display for this patient.   History reviewed. No pertinent surgical history.  Prior to Admission medications   Medication Sig Start Date End Date Taking? Authorizing Provider  apixaban (ELIQUIS) 2.5 MG TABS tablet Take 2.5 mg by mouth daily. 04/15/15  Yes Historical Provider, MD  Ipratropium-Albuterol (COMBIVENT RESPIMAT) 20-100 MCG/ACT AERS respimat  04/11/16  Yes Historical Provider, MD  metoprolol tartrate (LOPRESSOR) 25 MG tablet Take 25 mg by mouth 2 (two) times daily. 06/14/16  Yes Historical Provider, MD  potassium chloride (K-DUR,KLOR-CON) 10 MEQ tablet Take 10 mEq by mouth daily as needed. 08/16/16 08/16/17 Yes Historical Provider, MD  Vitamin D, Ergocalciferol, (DRISDOL) 50000 units CAPS capsule Take 50,000 Units by mouth every 30 (thirty) days. 04/18/14  Yes Historical Provider, MD  amLODipine (NORVASC) 5 MG tablet Take 5 mg by mouth daily.    Historical Provider, MD  beclomethasone (QVAR) 40 MCG/ACT inhaler Inhale 2 puffs into the lungs 2 (two) times daily.    Historical Provider, MD  doxycycline  (VIBRAMYCIN) 100 MG capsule Take 1 capsule (100 mg total) by mouth 2 (two) times daily. 01/01/17   Orbie Pyo, MD  enalapril (VASOTEC) 20 MG tablet Take 20 mg by mouth daily.    Historical Provider, MD  furosemide (LASIX) 20 MG tablet Take 20 mg by mouth daily.    Historical Provider, MD  simvastatin (ZOCOR) 20 MG tablet Take 20 mg by mouth daily.    Historical Provider, MD    Allergies Morphine and related and Penicillins  No family history on file.  Social History Social History  Substance Use Topics  . Smoking status: Never Smoker  . Smokeless tobacco: Never Used  . Alcohol use No    Review of Systems Constitutional: No fever/chills Eyes: No visual changes. ENT: No sore throat. Cardiovascular: Denies chest pain. Respiratory: Denies shortness of breath. Gastrointestinal: No abdominal pain.  No nausea, no vomiting.  No diarrhea.  No constipation. Genitourinary: Negative for dysuria. Musculoskeletal: Negative for back pain. Skin: as above Neurological: Negative for headaches, focal weakness or numbness.  10-point ROS otherwise negative.  ____________________________________________   PHYSICAL EXAM:  VITAL SIGNS: ED Triage Vitals  Enc Vitals Group     BP 01/01/17 1422 134/70     Pulse Rate 01/01/17 1422 95     Resp 01/01/17 1422 18     Temp 01/01/17 1422 98.2 F (36.8 C)     Temp Source 01/01/17 1422 Oral     SpO2 01/01/17 1422 95 %     Weight 01/01/17 1423 127 lb (57.6 kg)     Height  01/01/17 1423 5\' 2"  (1.575 m)     Head Circumference --      Peak Flow --      Pain Score 01/01/17 1423 0     Pain Loc --      Pain Edu? --      Excl. in [place]? --     Constitutional: Alert and oriented. Well appearing and in no acute distress. Eyes: Conjunctivae are normal. PERRL. EOMI. Head: Atraumatic. Nose: No congestion/rhinnorhea. Mouth/Throat: Mucous membranes are moist.   Neck: No stridor.   Cardiovascular: Normal rate, regular rhythm. Grossly normal heart  sounds.  Good peripheral circulation with intact posterior tibial pulse to the right lower extremity. Respiratory: Normal respiratory effort.  No retractions. Lungs CTAB. Gastrointestinal: Soft and nontender. No distention. No abdominal bruits. No CVA tenderness. Musculoskeletal: Bilateral lower extremity edema to the bilateral legs with the right being greater than the left.  No joint effusions. Neurologic:  Normal speech and language. No gross focal neurologic deficits are appreciated. No gait instability. Skin:  Erythema and warmth noted over the right lateral calf extending onto the anterior of the leg. The erythema does not go proximal to the knee. It is not completely circumferential. There is mild tenderness diffusely with moderate tenderness over the right lateral malleolus. Patient is able to range the ankle. Psychiatric: Mood and affect are normal. Speech and behavior are normal.  ____________________________________________   LABS (all labs ordered are listed, but only abnormal results are displayed)  Labs Reviewed  COMPREHENSIVE METABOLIC PANEL - Abnormal; Notable for the following:       Result Value   Glucose, Bld 115 (*)    All other components within normal limits  CBC WITH DIFFERENTIAL/PLATELET - Abnormal; Notable for the following:    RBC 3.46 (*)    MCV 102.4 (*)    MCH 35.1 (*)    RDW 16.0 (*)    Monocytes Absolute 1.2 (*)    All other components within normal limits   ____________________________________________  EKG   ____________________________________________  RADIOLOGY    DG Ankle Complete Right (Final result)  Result time 01/01/17 16:01:28  Final result by Rolla Flatten, MD (01/01/17 16:01:28)           Narrative:   CLINICAL DATA: Swelling, warmth, and redness over the entire ankle.  EXAM: RIGHT ANKLE - COMPLETE 3+ VIEW  COMPARISON: None.  FINDINGS: There is no evidence of fracture, dislocation, or joint effusion. There is no evidence of  arthropathy or other focal bone abnormality. Diffuse soft tissue swelling without foreign body or vascular calcification. Plantar and Achilles spurs.  IMPRESSION: No acute osseous findings. Soft tissue swelling uncertain significance.   Electronically Signed By: Staci Righter M.D. On: 01/01/2017 16:01         US Venous Img Lower Unilateral Right (Final result)  Result time 01/01/17 17:02:12  Final result by Lavonia Dana, MD (01/01/17 17:02:12)           Narrative:   CLINICAL DATA: RIGHT lower extremity swelling and erythema for 10 days, history hypertension, CHF  EXAM: RIGHT LOWER EXTREMITY VENOUS DOPPLER ULTRASOUND  TECHNIQUE: Gray-scale sonography with graded compression, as well as color Doppler and duplex ultrasound were performed to evaluate the lower extremity deep venous systems from the level of the common femoral vein and including the common femoral, femoral, profunda femoral, popliteal and calf veins including the posterior tibial, peroneal and gastrocnemius veins when visible. The superficial great saphenous vein was also interrogated. Spectral Doppler was utilized  to evaluate flow at rest and with distal augmentation maneuvers in the common femoral, femoral and popliteal veins.  COMPARISON: None  FINDINGS: Contralateral Common Femoral Vein: Respiratory phasicity is normal and symmetric with the symptomatic side. No evidence of thrombus. Normal compressibility.  Common Femoral Vein: No evidence of thrombus. Normal compressibility, respiratory phasicity and response to augmentation.  Saphenofemoral Junction: No evidence of thrombus. Normal compressibility and flow on color Doppler imaging.  Profunda Femoral Vein: No evidence of thrombus. Normal compressibility and flow on color Doppler imaging.  Femoral Vein: No evidence of thrombus. Normal compressibility, respiratory phasicity and response to augmentation.  Popliteal Vein: No evidence of  thrombus. Normal compressibility, respiratory phasicity and response to augmentation.  Calf Veins: No evidence of thrombus. Normal compressibility and flow on color Doppler imaging.  Superficial Great Saphenous Vein: No evidence of thrombus. Normal compressibility and flow on color Doppler imaging.  Venous Reflux: None.  Other Findings: Normal sized RIGHT inguinal lymph nodes incidentally noted.  IMPRESSION: No evidence of deep venous thrombosis in the RIGHT lower extremity.   Electronically Signed By: Lavonia Dana M.D. On: 01/01/2017 17:02            ____________________________________________   PROCEDURES  Procedure(s) performed:    Procedures  Critical Care performed:   ____________________________________________   INITIAL IMPRESSION / ASSESSMENT AND PLAN / ED COURSE  Pertinent labs & imaging results that were available during my care of the patient were reviewed by me and considered in my medical decision making (see chart for details).    Clinical Course     ----------------------------------------- 5:38 PM on 01/01/2017 -----------------------------------------  Patient with very reassuring lab work. Also with reassuring images without any acute findings on her DVT study as well as x-ray of the ankle. Likely cellulitis. She'll be getting a dose of vancomycin prior to leaving the emergency department and she'll be discharged on doxycycline. I gave the patient as well as family strict return instructions to come back to the emergency department for any worsening or concerning symptoms especially worsening redness, pain or fever. They're understanding the plan and willing to comply. The requested that the prescription be sent, on line, however I will also be giving them a paper prescription as a backup in case the online prescribing did not work. They're understanding the plan one to comply.  ____________________________________________   FINAL  CLINICAL IMPRESSION(S) / ED DIAGNOSES  Right lower extremity sialitis.    NEW MEDICATIONS STARTED DURING THIS VISIT:  New Prescriptions   DOXYCYCLINE (VIBRAMYCIN) 100 MG CAPSULE    Take 1 capsule (100 mg total) by mouth 2 (two) times daily.     Note:  This document was prepared using Dragon voice recognition software and may include unintentional dictation errors.    Orbie Pyo, MD 01/01/17 1739

## 2019-12-03 ENCOUNTER — Inpatient Hospital Stay
Admission: EM | Admit: 2019-12-03 | Discharge: 2019-12-07 | DRG: 392 | Disposition: A | Discharge status: Home Health | Payer: Medicare Other | Attending: Internal Medicine | Admitting: Internal Medicine

## 2019-12-03 ENCOUNTER — Other Ambulatory Visit: Payer: Self-pay

## 2019-12-03 ENCOUNTER — Emergency Department: Payer: Medicare Other

## 2019-12-03 DIAGNOSIS — I5032 Chronic diastolic (congestive) heart failure: Secondary | ICD-10-CM | POA: Diagnosis present

## 2019-12-03 DIAGNOSIS — K529 Noninfective gastroenteritis and colitis, unspecified: Secondary | ICD-10-CM | POA: Diagnosis present

## 2019-12-03 DIAGNOSIS — A09 Infectious gastroenteritis and colitis, unspecified: Secondary | ICD-10-CM | POA: Diagnosis not present

## 2019-12-03 DIAGNOSIS — Z7951 Long term (current) use of inhaled steroids: Secondary | ICD-10-CM | POA: Diagnosis not present

## 2019-12-03 DIAGNOSIS — Z885 Allergy status to narcotic agent status: Secondary | ICD-10-CM

## 2019-12-03 DIAGNOSIS — K625 Hemorrhage of anus and rectum: Secondary | ICD-10-CM

## 2019-12-03 DIAGNOSIS — I110 Hypertensive heart disease with heart failure: Secondary | ICD-10-CM | POA: Diagnosis present

## 2019-12-03 DIAGNOSIS — Z7901 Long term (current) use of anticoagulants: Secondary | ICD-10-CM | POA: Diagnosis not present

## 2019-12-03 DIAGNOSIS — D7589 Other specified diseases of blood and blood-forming organs: Secondary | ICD-10-CM | POA: Diagnosis present

## 2019-12-03 DIAGNOSIS — H9190 Unspecified hearing loss, unspecified ear: Secondary | ICD-10-CM | POA: Diagnosis present

## 2019-12-03 DIAGNOSIS — D539 Nutritional anemia, unspecified: Secondary | ICD-10-CM | POA: Diagnosis present

## 2019-12-03 DIAGNOSIS — E785 Hyperlipidemia, unspecified: Secondary | ICD-10-CM | POA: Diagnosis present

## 2019-12-03 DIAGNOSIS — D62 Acute posthemorrhagic anemia: Secondary | ICD-10-CM | POA: Diagnosis present

## 2019-12-03 DIAGNOSIS — R197 Diarrhea, unspecified: Secondary | ICD-10-CM

## 2019-12-03 DIAGNOSIS — I482 Chronic atrial fibrillation, unspecified: Secondary | ICD-10-CM | POA: Diagnosis not present

## 2019-12-03 DIAGNOSIS — Z20828 Contact with and (suspected) exposure to other viral communicable diseases: Secondary | ICD-10-CM | POA: Diagnosis present

## 2019-12-03 DIAGNOSIS — Z880 Allergy status to penicillin: Secondary | ICD-10-CM

## 2019-12-03 DIAGNOSIS — Z79899 Other long term (current) drug therapy: Secondary | ICD-10-CM

## 2019-12-03 DIAGNOSIS — K922 Gastrointestinal hemorrhage, unspecified: Secondary | ICD-10-CM

## 2019-12-03 DIAGNOSIS — R0602 Shortness of breath: Secondary | ICD-10-CM

## 2019-12-03 DIAGNOSIS — I10 Essential (primary) hypertension: Secondary | ICD-10-CM

## 2019-12-03 DIAGNOSIS — E559 Vitamin D deficiency, unspecified: Secondary | ICD-10-CM | POA: Diagnosis present

## 2019-12-03 HISTORY — DX: Malignant (primary) neoplasm, unspecified: C80.1

## 2019-12-03 HISTORY — DX: Gastrointestinal hemorrhage, unspecified: K92.2

## 2019-12-03 HISTORY — DX: Diarrhea, unspecified: R19.7

## 2019-12-03 HISTORY — DX: Noninfective gastroenteritis and colitis, unspecified: K52.9

## 2019-12-03 LAB — CBC
HCT: 30.1 % — ABNORMAL LOW (ref 36.0–46.0)
HCT: 27.2 % — ABNORMAL LOW (ref 36.0–46.0)
Hemoglobin: 10.3 g/dL — ABNORMAL LOW (ref 12.0–15.0)
Hemoglobin: 9.2 g/dL — ABNORMAL LOW (ref 12.0–15.0)
MCH: 33.9 pg (ref 26.0–34.0)
MCH: 33.7 pg (ref 26.0–34.0)
MCHC: 34.2 g/dL (ref 30.0–36.0)
MCHC: 33.8 g/dL (ref 30.0–36.0)
MCV: 99 fL (ref 80.0–100.0)
MCV: 99.6 fL (ref 80.0–100.0)
Platelets: 186 10*3/uL (ref 150–400)
Platelets: 153 10*3/uL (ref 150–400)
RBC: 3.04 MIL/uL — ABNORMAL LOW (ref 3.87–5.11)
RBC: 2.73 MIL/uL — ABNORMAL LOW (ref 3.87–5.11)
RDW: 20.4 % — ABNORMAL HIGH (ref 11.5–15.5)
RDW: 20.8 % — ABNORMAL HIGH (ref 11.5–15.5)
WBC: 9.9 10*3/uL (ref 4.0–10.5)
WBC: 9.9 10*3/uL (ref 4.0–10.5)
nRBC: 0.6 % — ABNORMAL HIGH (ref 0.0–0.2)
nRBC: 0.4 % — ABNORMAL HIGH (ref 0.0–0.2)

## 2019-12-03 LAB — URINALYSIS, COMPLETE (UACMP) WITH MICROSCOPIC
Bacteria, UA: NONE SEEN
Bilirubin Urine: NEGATIVE
Glucose, UA: NEGATIVE mg/dL
Hgb urine dipstick: NEGATIVE
Ketones, ur: NEGATIVE mg/dL
Leukocytes,Ua: NEGATIVE
Nitrite: NEGATIVE
Protein, ur: NEGATIVE mg/dL
Specific Gravity, Urine: 1.021 (ref 1.005–1.030)
Squamous Epithelial / LPF: NONE SEEN (ref 0–5)
pH: 7 (ref 5.0–8.0)

## 2019-12-03 LAB — BASIC METABOLIC PANEL
Anion gap: 10 (ref 5–15)
BUN: 26 mg/dL — ABNORMAL HIGH (ref 8–23)
CO2: 27 mmol/L (ref 22–32)
Calcium: 9.4 mg/dL (ref 8.9–10.3)
Chloride: 99 mmol/L (ref 98–111)
Creatinine, Ser: 0.93 mg/dL (ref 0.44–1.00)
GFR calc Af Amer: >60 mL/min (ref >60)
GFR calc non Af Amer: 53 mL/min — ABNORMAL LOW (ref >60)
Glucose, Bld: 118 mg/dL — ABNORMAL HIGH (ref 70–99)
Potassium: 4 mmol/L (ref 3.5–5.1)
Sodium: 136 mmol/L (ref 135–145)

## 2019-12-03 LAB — SAMPLE TO BLOOD BANK

## 2019-12-03 LAB — TYPE AND SCREEN
ABO/RH(D): O POS
Antibody Screen: NEGATIVE

## 2019-12-03 LAB — LACTIC ACID, PLASMA: Lactic Acid, Venous: 1.4 mmol/L (ref 0.5–1.9)

## 2019-12-03 MED ORDER — ACETAMINOPHEN 650 MG RE SUPP: 650.0000 mg | Freq: Four times a day (QID) | RECTAL | Status: DC | PRN

## 2019-12-03 MED ORDER — ACETAMINOPHEN 325 MG PO TABS: 650.0000 mg | ORAL_TABLET | Freq: Four times a day (QID) | ORAL | Status: DC | PRN

## 2019-12-03 MED ORDER — SODIUM CHLORIDE 0.9 % IV SOLN: Freq: Once | INTRAVENOUS | Status: AC

## 2019-12-03 MED ORDER — PANTOPRAZOLE SODIUM 40 MG IV SOLR
40.0000 mg | Freq: Two times a day (BID) | INTRAVENOUS | Status: DC
  Administered 2019-12-07: 09:00:00 40 mg via INTRAVENOUS
  Filled 2019-12-03: qty 40

## 2019-12-03 MED ORDER — METRONIDAZOLE IN NACL 5-0.79 MG/ML-% IV SOLN
500.0000 mg | Freq: Three times a day (TID) | INTRAVENOUS | Status: DC
  Administered 2019-12-04 – 2019-12-07 (×11): 500 mg via INTRAVENOUS
  Filled 2019-12-03 (×16): qty 100

## 2019-12-03 MED ORDER — IOHEXOL 300 MG/ML  SOLN
100.0000 mL | Freq: Once | INTRAMUSCULAR | Status: AC | PRN
  Administered 2019-12-03: 100 mL via INTRAVENOUS

## 2019-12-03 MED ORDER — SODIUM CHLORIDE 0.9 % IV BOLUS
500.0000 mL | Freq: Once | INTRAVENOUS | Status: AC
  Administered 2019-12-03: 15:00:00 500 mL via INTRAVENOUS

## 2019-12-03 MED ORDER — IOHEXOL 9 MG/ML PO SOLN
500.0000 mL | Freq: Once | ORAL | Status: AC | PRN
  Administered 2019-12-03: 15:00:00 500 mL via ORAL

## 2019-12-03 MED ORDER — SODIUM CHLORIDE 0.9 % IV SOLN: 80.0000 mg | Freq: Once | INTRAVENOUS | Status: DC

## 2019-12-03 MED ORDER — PANTOPRAZOLE SODIUM 40 MG IV SOLR: 40.0000 mg | INTRAVENOUS | Status: AC

## 2019-12-03 MED ORDER — SODIUM CHLORIDE 0.9 % IV SOLN
2.0000 g | Freq: Once | INTRAVENOUS | Status: AC
  Administered 2019-12-03: 2 g via INTRAVENOUS
  Filled 2019-12-03: qty 20

## 2019-12-03 MED ORDER — SODIUM CHLORIDE 0.9 % IV SOLN
2.0000 g | INTRAVENOUS | Status: DC
  Administered 2019-12-04 – 2019-12-06 (×3): 2 g via INTRAVENOUS
  Filled 2019-12-03: qty 2
  Filled 2019-12-03: qty 20
  Filled 2019-12-03 (×2): qty 2

## 2019-12-03 MED ORDER — SODIUM CHLORIDE 0.9% FLUSH
3.0000 mL | Freq: Once | INTRAVENOUS | Status: AC
  Administered 2019-12-03: 15:00:00 3 mL via INTRAVENOUS

## 2019-12-03 MED ORDER — SODIUM CHLORIDE 0.9 % IV BOLUS
500.0000 mL | Freq: Once | INTRAVENOUS | Status: AC
  Administered 2019-12-03: 500 mL via INTRAVENOUS

## 2019-12-03 MED ORDER — METRONIDAZOLE IN NACL 5-0.79 MG/ML-% IV SOLN
500.0000 mg | Freq: Once | INTRAVENOUS | Status: AC
  Administered 2019-12-03: 500 mg via INTRAVENOUS
  Filled 2019-12-03: qty 100

## 2019-12-03 NOTE — ED Notes (Signed)
Mickel Baas NT assisted pt back into bed.

## 2019-12-03 NOTE — ED Notes (Signed)
Pt urinated in briefs again. Asked to try and tell this RN next time so a urine sample can be collected. Peri care provided. Briefs changed. Pt repositioned in bed. Bed locked low. Rails up. Call bell within reach.

## 2019-12-03 NOTE — ED Notes (Signed)
Pt urinated in her briefs. Peri care provided. No blood noted when briefs changed. Pt turns well in bed. Sacrum pink. Bed locked low. Rails up. Call bell within reach. Daughter at bedside. Pt remains in Afib with frequent PVCs.

## 2019-12-03 NOTE — ED Notes (Signed)
EDP Siadecki to bedside.  

## 2019-12-03 NOTE — ED Triage Notes (Signed)
Pt here from home via EMS. Pt c/o diarrhea that started last night. Pt states that her sitter said her diarrhea was bright red.   States that she had abdominal pain all night that has subsided now. Also complaining of generalized weakness.

## 2019-12-03 NOTE — ED Notes (Signed)
Pt c/o generalized abdominal pain that started last night, has since resolved. Pt also c/o several episodes of diarrhea after eating broccoli cheddar soup. Pt reports per caregiver that she has during the day pt with noted rectal bleeding this morning and continued diarrhea. Abd non-tender with palpation. Pt noted to be alert, able to answer questions, HOH on assessment at this time.

## 2019-12-03 NOTE — ED Provider Notes (Addendum)
Procedures  ----------------------------------------- 6:30 PM on 12/03/2019 -----------------------------------------   Assumed care from Dr. Cherylann Banas pending CT scan and repeat hemoglobin test.  The CT does reveal colitis of the transverse and descending colon.  Hemoglobin is downtrending with a prior baseline of about 12, today's hemoglobin levels are 10 and then 9 at last check.  Recommended hospitalization for IV antibiotics and ensuring that GI bleeding has resolved to which patient agrees.  We will do Covid screening, order ceftriaxone and Flagyl IV.  Consult hospitalist.  Clinical Course as of Dec 02 1909  Tue Dec 03, 2019  1908 Discussed with hospitalist.  They requested I consult gastroenterology prior to being admitted.  I discussed with GI Dr. Vicente Males who advises colitis around the splenic flexure can be due to ischemia, recommends optimizing hydration and ensuring that there is no hypotension.  He will plan to see the patient in the morning.   [PS]    Clinical Course User Index [PS] Carrie Mew, MD     Final diagnoses:  Diarrhea, unspecified type  Rectal bleeding  Colitis  Acute blood loss anemia      Carrie Mew, MD 12/03/19 Merrily Pew    Carrie Mew, MD 12/03/19 1910

## 2019-12-03 NOTE — ED Notes (Signed)
Consulting provider at bedside

## 2019-12-03 NOTE — ED Notes (Signed)
Pt asleep upon this RN's entrance to room. Daughter remains with pt. Daughter denies any needs currently.

## 2019-12-03 NOTE — ED Notes (Signed)
Pt given fresh warm blankets. Daughter remains at bedside.

## 2019-12-03 NOTE — ED Notes (Signed)
Attempted report to floor.  

## 2019-12-03 NOTE — ED Notes (Signed)
This RN at bedside with MD, rectal exam performed by EDP. Per EDP pt Hemoccult +.

## 2019-12-03 NOTE — ED Provider Notes (Signed)
[facility] Emergency Department Provider Note ____________________________________________   First MD Initiated Contact with Patient 12/03/19 1356     (approximate)  I have reviewed the triage vital signs and the nursing notes.   HISTORY  Chief Complaint Diarrhea and Weakness    HPI Rhonda Mcclure is a 85 y.o. female with PMH as noted below who presents with diarrhea since last night, with multiple episodes of watery stool.  Her last one was earlier this morning.  She reports bright red blood in the diarrhea although this seems to have subsided.  She states that she had some crampy abdominal pain that has resolved but denies any nausea or vomiting.  She does have a history of hemorrhoids.  Past Medical History:  Diagnosis Date  . CHF (congestive heart failure) ([place])   . Heart valve disorder   . Hypercholesteremia   . Hypertension     There are no problems to display for this patient.   History reviewed. No pertinent surgical history.  Prior to Admission medications   Medication Sig Start Date End Date Taking? Authorizing Provider  amLODipine (NORVASC) 5 MG tablet Take 5 mg by mouth daily.    [provider]  apixaban (ELIQUIS) 2.5 MG TABS tablet Take 2.5 mg by mouth daily. 04/15/15   [provider]  beclomethasone (QVAR) 40 MCG/ACT inhaler Inhale 2 puffs into the lungs 2 (two) times daily.    [provider]  doxycycline (VIBRA-TABS) 100 MG tablet Take 1 tablet (100 mg total) by mouth 2 (two) times daily. 01/01/17   Schaevitz, Randall An, MD  doxycycline (VIBRAMYCIN) 100 MG capsule Take 1 capsule (100 mg total) by mouth 2 (two) times daily. 01/01/17   Schaevitz, Randall An, MD  enalapril (VASOTEC) 20 MG tablet Take 20 mg by mouth daily.    [provider]  furosemide (LASIX) 20 MG tablet Take 20 mg by mouth daily.    [provider]  Ipratropium-Albuterol (COMBIVENT RESPIMAT) 20-100 MCG/ACT AERS  respimat  04/11/16   [provider]  metoprolol tartrate (LOPRESSOR) 25 MG tablet Take 25 mg by mouth 2 (two) times daily. 06/14/16   [provider]  potassium chloride (K-DUR,KLOR-CON) 10 MEQ tablet Take 10 mEq by mouth daily as needed. 08/16/16 08/16/17  [provider]  simvastatin (ZOCOR) 20 MG tablet Take 20 mg by mouth daily.    [provider]  Vitamin D, Ergocalciferol, (DRISDOL) 50000 units CAPS capsule Take 50,000 Units by mouth every 30 (thirty) days. 04/18/14   [provider]    Allergies Sulfamethoxazole, Morphine and related, and Penicillins  History reviewed. No pertinent family history.  Social History Social History   Tobacco Use  . Smoking status: Never Smoker  . Smokeless tobacco: Never Used  Substance Use Topics  . Alcohol use: No  . Drug use: Not on file    Review of Systems  Constitutional: No fever. Eyes: No redness. ENT: No sore throat. Cardiovascular: Denies chest pain. Respiratory: Denies shortness of breath. Gastrointestinal: Positive for diarrhea. Genitourinary: Negative for dysuria.  Musculoskeletal: Negative for back pain. Skin: Negative for rash. Neurological: Negative for headache.   ____________________________________________   PHYSICAL EXAM:  VITAL SIGNS: ED Triage Vitals  Enc Vitals Group     BP 12/03/19 1220 128/73     Pulse Rate 12/03/19 1220 99     Resp 12/03/19 1220 18     Temp 12/03/19 1220 98.1 F (36.7 C)     Temp Source 12/03/19 1220  Oral     SpO2 12/03/19 1220 97 %     Weight 12/03/19 1221 130 lb (59 kg)     Height 12/03/19 1221 5\' 4"  (1.626 m)     Head Circumference --      Peak Flow --      Pain Score 12/03/19 1221 0     Pain Loc --      Pain Edu? --      Excl. in [place]? --     Constitutional: Alert and oriented. Well appearing for age and in no acute distress. Eyes: Conjunctivae are normal.  No scleral icterus. Head: Atraumatic. Nose: No  congestion/rhinnorhea. Mouth/Throat: Mucous membranes are moist.   Neck: Normal range of motion.  Cardiovascular: Normal rate, regular rhythm.  Good peripheral circulation. Respiratory: Normal respiratory effort.  No retractions.  Gastrointestinal: Soft with mild discomfort to palpation but no focal tenderness. No distention.  No external hemorrhoid or active bleeding on DRE.  Brown stool, guaiac positive. Genitourinary: No flank tenderness. Musculoskeletal: No lower extremity edema.  Extremities warm and well perfused.  Neurologic:  Normal speech and language. No gross focal neurologic deficits are appreciated.  Skin:  Skin is warm and dry. No rash noted. Psychiatric: Mood and affect are normal. Speech and behavior are normal.  ____________________________________________   LABS (all labs ordered are listed, but only abnormal results are displayed)  Labs Reviewed  BASIC METABOLIC PANEL - Abnormal; Notable for the following components:      Result Value   Glucose, Bld 118 (*)    BUN 26 (*)    GFR calc non Af Amer 53 (*)    All other components within normal limits  CBC - Abnormal; Notable for the following components:   RBC 3.04 (*)    Hemoglobin 10.3 (*)    HCT 30.1 (*)    RDW 20.4 (*)    nRBC 0.6 (*)    All other components within normal limits  LACTIC ACID, PLASMA  URINALYSIS, COMPLETE (UACMP) WITH MICROSCOPIC  LACTIC ACID, PLASMA  CBG MONITORING, ED  SAMPLE TO BLOOD BANK  TYPE AND SCREEN   ____________________________________________  EKG  ED ECG REPORT I, Arta Silence, the attending physician, personally viewed and interpreted this ECG.  Date: 12/03/2019 EKG Time: 1223 Rate: 105 Rhythm: Atrial fibrillation QRS Axis: normal Intervals: normal ST/T Wave abnormalities: normal Narrative Interpretation: no evidence of acute ischemia  ____________________________________________  RADIOLOGY  CT abdomen:  Pending  ____________________________________________   PROCEDURES  Procedure(s) performed: No  Procedures  Critical Care performed: No ____________________________________________   INITIAL IMPRESSION / ASSESSMENT AND PLAN / ED COURSE  Pertinent labs & imaging results that were available during my care of the patient were reviewed by me and considered in my medical decision making (see chart for details).  85 year old female with PMH as noted above presents with diarrhea since last night which now seems to have mostly subsided.  There was some bright red blood in the stool.  She had abdominal pain earlier which has resolved.  She has had no vomiting or fever.  On exam, she is overall relatively well-appearing for her age.  Her vital signs are normal except for heart rate in the low 100s.  She is in atrial fibrillation on her EKG.  The abdomen is soft with no focal tenderness.  She has no active rectal bleeding or visible hemorrhoid, but her stool is guaiac positive.  Overall presentation is most consistent with colitis, diverticulitis, or gastroenteritis.  Differential also includes bleeding  from internal hemorrhoid.  I suspect that the tachycardia is due to mild dehydration.  We will obtain lab work-up, CT abdomen, give a small fluid bolus and reassess.  ----------------------------------------- 3:28 PM on 12/03/2019 -----------------------------------------  CT abdomen is pending.  I am signing the patient out to the oncoming physician Dr. Joni Fears.   ____________________________________________   FINAL CLINICAL IMPRESSION(S) / ED DIAGNOSES  Final diagnoses:  Diarrhea, unspecified type  Rectal bleeding      NEW MEDICATIONS STARTED DURING THIS VISIT:  New Prescriptions   No medications on file     Note:  This document was prepared using Dragon voice recognition software and may include unintentional dictation errors.    Arta Silence, MD 12/03/19  1529

## 2019-12-03 NOTE — ED Notes (Signed)
NT gave pt water. Offered pt other things that would fall under clear liquid diet.

## 2019-12-03 NOTE — ED Triage Notes (Signed)
Pt comes into the ED via EMS from home with c/o diarrhea for the past 3 days today had an episode of bright red blood that saturated a brief today,denies abd pain. Having some generalized weakness.

## 2019-12-03 NOTE — Progress Notes (Signed)
CTX order for patient w/documented PCN allergy- remote allergic reaction of total body hives as reported by her daughter. Patient, herself, denies SOB. Per patient's daughter, patient has had combination forms of PCN and has been fine. D/w Dr. Joni Fears, it should be okay for patient to get CTX.   Kristeen Miss, PharmD Clinical Pharmacist

## 2019-12-03 NOTE — ED Notes (Signed)
Mickel Baas NT assisted pt to bedside toilet to attempt for BM as pt requested. Daughter remains with pt.

## 2019-12-03 NOTE — ED Notes (Signed)
External catheter was placed on patient. She is comfortable in bed with family,call bell within reach.

## 2019-12-03 NOTE — ED Notes (Signed)
Pt leaving for imaging.

## 2019-12-03 NOTE — H&P (Signed)
History and Physical  Rhonda Mcclure [id number] DOB: August 02, 1923 DOA: 12/03/2019  Referring physician: Carrie Mew MD PCP: [facility], [facility]  Patient coming from: Home  Chief Complaint: Diarrhea and weakness  HPI: Rhonda Mcclure is a 85 y.o. female with medical history significant for CHF, hypertension and hyperlipidemia who presents to the emergency department via EMS and accompanied by daughter due to complaints of diarrhea and weakness which started yesterday.  Patient was hard of hearing, history was provided by daughter at bedside.  Per daughter, patient complained of left lower quadrant abdominal pain (crampy in nature) accompanied with diarrhea yesterday, during the course of the day, abdominal pain improved.  This morning, she noted bright red blood in her stool which was witnessed by health care aide that notified the daughter.  Daughter states that she asked her daughter-in-law (a Marine scientist) and she recommended for patient to be taken to the ED for further evaluation and management.  Patient denies nausea, vomiting, fever, chills, chest pain or shortness of breath.  ED Course:  In the emergency department, she was tachycardic and tachypneic, but other vital signs were within normal range.  Hemoglobin was 10.3 on 1st lab at ED, but this decreased to 9.2 on subsequent CBC.  Lactic acid and urinalysis were normal.  CT abdomen and pelvis with contrast showed findings consistent with infectious or inflammatory colitis over the mid to distal transverse and proximal descending colon.  He was treated with IV ceftriaxone and Flagyl.  Regarding patient's GI bleed, ED physician states that he spoke with gastroenterologist(Dr. Vicente Males) who recommended monitoring hemoglobin overnight with plan to see patient in the morning.  Hospitalist was asked to admit him for further evaluation and management.  Review of Systems: Constitutional: Negative for chills and fever.  HENT: Hard of hearing.  Negative for ear pain and sore throat.   Eyes: Negative for pain and visual disturbance.  Respiratory: Negative for cough, chest tightness and shortness of breath.   Cardiovascular: Negative for chest pain and palpitations.  Gastrointestinal: Positive for blood in stool and diarrhea.Negative for abdominal pain and vomiting.  Endocrine: Negative for polyphagia and polyuria.  Genitourinary: Negative for decreased urine volume, dysuria, enuresis, hematuria, Musculoskeletal: Negative for arthralgias and back pain.  Skin: Negative for color change and rash.  Allergic/Immunologic: Negative for immunocompromised state.  Neurological: Negative for tremors, syncope, speech difficulty, weakness, light-headedness and headaches.  Hematological: Does not bruise/bleed easily.  All other systems reviewed and are negative  Past Medical History:  Diagnosis Date  . Cancer ([place])   . CHF (congestive heart failure) ([place])   . Heart valve disorder   . Hypercholesteremia   . Hypertension    History reviewed. No pertinent surgical history.  Social History:  reports that she has never smoked. She has never used smokeless tobacco. She reports that she does not drink alcohol. No history on file for drug.   Allergies  Allergen Reactions  . Sulfamethoxazole Swelling    Pt had thick tongue and felt disoriented Pt had thick tongue and felt disoriented   . Morphine And Related   . Penicillins Hives    Per patient, total body hives. No SOB noted. Young adulthood. Per daughter, who reported that at some point her mother was given a combination form of PCN and did okay with it. However, the daughter is unable to remember the name of the medication.     History reviewed. No pertinent family history.   Prior to Admission medications   Medication Sig  Start Date End Date Taking? Authorizing Provider  amLODipine (NORVASC) 5 MG tablet Take 5 mg by mouth daily.   Yes [provider]  apixaban (ELIQUIS) 2.5  MG TABS tablet Take 2.5 mg by mouth daily. 04/15/15  Yes [provider]  cholecalciferol (VITAMIN D3) 25 MCG (1000 UT) tablet Take 1,000 Units by mouth daily.   Yes [provider]  enalapril (VASOTEC) 20 MG tablet Take 20 mg by mouth daily.   Yes [provider]  FLOVENT HFA 220 MCG/ACT inhaler Inhale 2 puffs into the lungs 2 (two) times daily. 10/17/19  Yes [provider]  furosemide (LASIX) 20 MG tablet Take 20 mg by mouth daily.   Yes [provider]  Ipratropium-Albuterol (COMBIVENT RESPIMAT) 20-100 MCG/ACT AERS respimat 1 puff daily as needed.  04/11/16  Yes [provider]  metoprolol tartrate (LOPRESSOR) 25 MG tablet Take 25 mg by mouth 2 (two) times daily. 06/14/16  Yes [provider]  potassium chloride (K-DUR,KLOR-CON) 10 MEQ tablet Take 10 mEq by mouth daily.  08/16/16 12/03/19 Yes [provider]  simvastatin (ZOCOR) 20 MG tablet Take 20 mg by mouth daily.   Yes [provider]    Physical Exam: BP 128/73   Pulse (!) 111   Temp 98.1 F (36.7 C) (Oral)   Resp (!) 23   Ht 5\' 4"  (1.626 m)   Wt 59 kg   SpO2 100%   BMI 22.31 kg/m   . General: 85 y.o. year-old female well developed well nourished in no acute distress.  Alert and oriented x3. Marland Kitchen HEENT: Normocephalic, atraumatic, PERRLA . NECK: Supple, trachea medial . Cardiovascular: Regular rate and rhythm with no rubs or gallops.  No thyromegaly or JVD noted.  No lower extremity edema. 2/4 pulses in all 4 extremities. Marland Kitchen Respiratory: Clear to auscultation with no wheezes or rales. Good inspiratory effort. . Abdomen: Soft nontender nondistended with normal bowel sounds x4 quadrants. . Muskuloskeletal: No cyanosis, clubbing or edema noted bilaterally . Neuro: CN II-XII intact, strength, sensation, reflexes . Skin: No ulcerative lesions noted or rashes . Psychiatry: Judgement and insight appear normal. Mood is appropriate for condition and setting           Labs on Admission:  Basic Metabolic Panel: Recent Labs  Lab 12/03/19 1225  NA 136  K 4.0  CL 99  CO2 27  GLUCOSE 118*  BUN 26*  CREATININE 0.93  CALCIUM 9.4   Liver Function Tests: No results for input(s): AST, ALT, ALKPHOS, BILITOT, PROT, ALBUMIN in the last 168 hours. No results for input(s): LIPASE, AMYLASE in the last 168 hours. No results for input(s): AMMONIA in the last 168 hours. CBC: Recent Labs  Lab 12/03/19 1225 12/03/19 1726  WBC 9.9 9.9  HGB 10.3* 9.2*  HCT 30.1* 27.2*  MCV 99.0 99.6  PLT 186 153   Cardiac Enzymes: No results for input(s): CKTOTAL, CKMB, CKMBINDEX, TROPONINI in the last 168 hours.  BNP (last 3 results) No results for input(s): BNP in the last 8760 hours.  ProBNP (last 3 results) No results for input(s): PROBNP in the last 8760 hours.  CBG: No results for input(s): GLUCAP in the last 168 hours.  Radiological Exams on Admission: CT ABDOMEN PELVIS W CONTRAST  Result Date: 12/03/2019 CLINICAL DATA:  Diarrhea for 3 days. Bright red blood per rectum today. EXAM: CT ABDOMEN AND PELVIS WITH CONTRAST TECHNIQUE: Multidetector CT imaging of the abdomen and pelvis was performed using the standard protocol following bolus administration  of intravenous contrast. CONTRAST:  100 mL OMNIPAQUE IOHEXOL 300 MG/ML  SOLN COMPARISON:  CT abdomen and pelvis 01/05/2015. FINDINGS: Lower chest: Lung bases are emphysematous with basilar atelectasis. There is cardiomegaly. No pleural or pericardial effusion. Hepatobiliary: A few small stones are seen layering dependently in the gallbladder. No evidence of cholecystitis. No focal liver lesion. Biliary tree unremarkable. Pancreas: Unremarkable. No pancreatic ductal dilatation or surrounding inflammatory changes. Spleen: Normal in size without focal abnormality. Adrenals/Urinary Tract: Adrenal glands are unremarkable. Kidneys are normal, without renal calculi, solid lesion, or hydronephrosis. Bladder is unremarkable.  Small renal cysts noted. Stomach/Bowel: There is thickening of the walls of the mid to distal transverse and proximal descending colon consistent with infectious or inflammatory colitis. No pneumatosis, portal venous gas or free air. No bowel obstruction. The stomach and small bowel are unremarkable. No evidence of appendicitis. Vascular/Lymphatic: Aortic atherosclerosis. No enlarged abdominal or pelvic lymph nodes. Reproductive: Status post hysterectomy. No adnexal masses. Other: Small volume of free pelvic fluid noted. Musculoskeletal: Biconcave compression fracture of L2 is new since the prior examination but appears remote. No acute or focal abnormality is identified. IMPRESSION: Findings most consistent with infectious or inflammatory colitis of the mid to distal transverse and proximal descending colon. No CT evidence of ischemia. Small volume of free pelvic fluid is likely secondary to colitis. Atherosclerosis. Electronically Signed   By: Inge Rise M.D.   On: 12/03/2019 17:00    EKG: I independently viewed the EKG done and my findings are as followed: EKG was not done   Assessment/Plan Present on Admission: . Colitis  Principal Problem:   Colitis Active Problems:   Essential hypertension   Atrial fibrillation, chronic (HCC)   Diarrhea   Hyperlipidemia   GI bleed   Acute GI bleed in the setting of acute diarrhea due to acute colitis Patient presents with GI bleed, however it was noted that she was on Eliquis due to history of A. Fib, it is unknown at this time if the GI bleed was due to Eliquis vs colitis.  CT abdomen and pelvis with contrast showed findings consistent with infectious or inflammatory colitis over the mid to distal transverse and proximal descending colon. Patient will be admitted to [facility] at 72 mLs/Hr Continue IV ceftriaxone and Flagyl Continue IV Zofran p.r.n. Hold breakfast in anticipation for possible endoscopy in the morning Continue  Protonix Continue to monitor H/H Helicobacter pylori Ab and stool Ag will be checked GI consult already placed to Dr. Vicente Males  Chronic atrial fibrillation Patient was on Eliquis; this will be held at this time due to GI bleed Continue home metoprolol when med rec is updated  Essential hypertension Continue home meds when med rec is updated  Hyperlipidemia Continue home statin when med rec is updated   DVT prophylaxis: SCDs  Code Status: Full  Family Communication: Daughter at bedside (all questions answered)  Disposition Plan: Home once clinically stable  Consults called: Dr Vicente Males (gastroenterologist)  Admission status: Inpatient    Bernadette Hoit MD [facility]  If 7PM-7AM, please contact night-coverage [email or web address]  12/03/2019, 9:18 PM

## 2019-12-04 DIAGNOSIS — K625 Hemorrhage of anus and rectum: Secondary | ICD-10-CM

## 2019-12-04 DIAGNOSIS — K529 Noninfective gastroenteritis and colitis, unspecified: Secondary | ICD-10-CM

## 2019-12-04 LAB — CBC
HCT: 28.9 % — ABNORMAL LOW (ref 36.0–46.0)
Hemoglobin: 9.5 g/dL — ABNORMAL LOW (ref 12.0–15.0)
MCH: 34.4 pg — ABNORMAL HIGH (ref 26.0–34.0)
MCHC: 32.9 g/dL (ref 30.0–36.0)
MCV: 104.7 fL — ABNORMAL HIGH (ref 80.0–100.0)
Platelets: 166 10*3/uL (ref 150–400)
RBC: 2.76 MIL/uL — ABNORMAL LOW (ref 3.87–5.11)
RDW: 20.6 % — ABNORMAL HIGH (ref 11.5–15.5)
WBC: 9 10*3/uL (ref 4.0–10.5)
nRBC: 0.4 % — ABNORMAL HIGH (ref 0.0–0.2)

## 2019-12-04 LAB — FERRITIN: Ferritin: 428 ng/mL — ABNORMAL HIGH (ref 11–307)

## 2019-12-04 LAB — COMPREHENSIVE METABOLIC PANEL
ALT: 8 U/L (ref 0–44)
AST: 18 U/L (ref 15–41)
Albumin: 3.7 g/dL (ref 3.5–5.0)
Alkaline Phosphatase: 52 U/L (ref 38–126)
Anion gap: 9 (ref 5–15)
BUN: 19 mg/dL (ref 8–23)
CO2: 25 mmol/L (ref 22–32)
Calcium: 8.8 mg/dL — ABNORMAL LOW (ref 8.9–10.3)
Chloride: 102 mmol/L (ref 98–111)
Creatinine, Ser: 0.71 mg/dL (ref 0.44–1.00)
GFR calc Af Amer: >60 mL/min (ref >60)
GFR calc non Af Amer: >60 mL/min (ref >60)
Glucose, Bld: 87 mg/dL (ref 70–99)
Potassium: 3.8 mmol/L (ref 3.5–5.1)
Sodium: 136 mmol/L (ref 135–145)
Total Bilirubin: 1.1 mg/dL (ref 0.3–1.2)
Total Protein: 6.2 g/dL — ABNORMAL LOW (ref 6.5–8.1)

## 2019-12-04 LAB — VITAMIN B12: Vitamin B-12: 316 pg/mL (ref 180–914)

## 2019-12-04 LAB — MAGNESIUM: Magnesium: 1.6 mg/dL — ABNORMAL LOW (ref 1.7–2.4)

## 2019-12-04 LAB — IRON AND TIBC
Iron: 33 ug/dL (ref 28–170)
Saturation Ratios: 13 % (ref 10.4–31.8)
TIBC: 245 ug/dL — ABNORMAL LOW (ref 250–450)
UIBC: 212 ug/dL

## 2019-12-04 LAB — PROTIME-INR
INR: 1.6 — ABNORMAL HIGH (ref 0.8–1.2)
Prothrombin Time: 19 seconds — ABNORMAL HIGH (ref 11.4–15.2)

## 2019-12-04 LAB — PHOSPHORUS: Phosphorus: 2.8 mg/dL (ref 2.5–4.6)

## 2019-12-04 LAB — SARS CORONAVIRUS 2 (TAT 6-24 HRS): SARS Coronavirus 2: NEGATIVE

## 2019-12-04 LAB — APTT: aPTT: 43 seconds — ABNORMAL HIGH (ref 24–36)

## 2019-12-04 LAB — FOLATE: Folate: 21.9 ng/mL (ref >5.9)

## 2019-12-04 MED ORDER — SIMVASTATIN 20 MG PO TABS
20.0000 mg | ORAL_TABLET | Freq: Every day | ORAL | Status: DC
  Administered 2019-12-04 – 2019-12-07 (×4): 20 mg via ORAL
  Filled 2019-12-04 (×4): qty 1

## 2019-12-04 MED ORDER — AMLODIPINE BESYLATE 5 MG PO TABS
5.0000 mg | ORAL_TABLET | Freq: Every day | ORAL | Status: DC
  Administered 2019-12-04: 18:00:00 5 mg via ORAL
  Filled 2019-12-04: qty 1

## 2019-12-04 MED ORDER — VITAMIN D3 25 MCG (1000 UNIT) PO TABS
1000.0000 [IU] | ORAL_TABLET | Freq: Every day | ORAL | Status: DC
  Administered 2019-12-04 – 2019-12-07 (×4): 1000 [IU] via ORAL
  Filled 2019-12-04 (×7): qty 1

## 2019-12-04 MED ORDER — SODIUM CHLORIDE 0.9 % IV SOLN
INTRAVENOUS | Status: DC | PRN
  Administered 2019-12-04 – 2019-12-06 (×3): 1000 mL via INTRAVENOUS

## 2019-12-04 MED ORDER — IPRATROPIUM-ALBUTEROL 0.5-2.5 (3) MG/3ML IN SOLN
3.0000 mL | Freq: Every day | RESPIRATORY_TRACT | Status: DC | PRN
  Administered 2019-12-05 – 2019-12-06 (×2): 3 mL via RESPIRATORY_TRACT
  Filled 2019-12-04: qty 3
  Filled 2019-12-04: qty 9

## 2019-12-04 MED ORDER — BUDESONIDE 0.5 MG/2ML IN SUSP
1.0000 mg | Freq: Two times a day (BID) | RESPIRATORY_TRACT | Status: DC
  Administered 2019-12-04 – 2019-12-07 (×6): 1 mg via RESPIRATORY_TRACT
  Filled 2019-12-04 (×6): qty 4

## 2019-12-04 MED ORDER — METOPROLOL TARTRATE 25 MG PO TABS
25.0000 mg | ORAL_TABLET | Freq: Two times a day (BID) | ORAL | Status: DC
  Administered 2019-12-04 – 2019-12-05 (×2): 25 mg via ORAL
  Filled 2019-12-04 (×2): qty 1

## 2019-12-04 MED ORDER — MAGNESIUM SULFATE 2 GM/50ML IV SOLN
2.0000 g | Freq: Once | INTRAVENOUS | Status: AC
  Administered 2019-12-04: 2 g via INTRAVENOUS
  Filled 2019-12-04: qty 50

## 2019-12-04 MED ORDER — ONDANSETRON HCL 4 MG/2ML IJ SOLN: 4.0000 mg | Freq: Four times a day (QID) | INTRAMUSCULAR | Status: DC | PRN

## 2019-12-04 NOTE — Progress Notes (Signed)
Patient transferred from the ED to room 251 and is already in hospital bed. Oriented patient to the room and room equipment. Bed alarm per fall risk protocol.  Patient alert and oriented x3, and is very hard of hearing.Currently complains of no pain or discomfort. Will continue to monitor patient and give report to oncoming nurse.

## 2019-12-04 NOTE — Consult Note (Signed)
Cephas Darby, MD [address]  [place]  [place], [place] [address]  Main: [phone]  Fax: [phone] Pager: [phone]   Consultation  Referring Provider:     No ref. provider found Primary Care Physician:  [facility] Primary Gastroenterologist:  None         Reason for Consultation:     Rectal bleeding  Date of Admission:  12/03/2019 Date of Consultation:  12/04/2019         HPI:   Rhonda Mcclure is a 85 y.o. female with hypertension, hyperlipidemia, A. fib on Eliquis who is a retired Therapist, sports is brought to the ER yesterday due to rectal bleeding associated with diarrhea.  Patient lives alone with a sitter and her daughter lives next door.  According to patient's daughter who provided most of the history, that she noticed rectal bleeding since yesterday, few episodes and therefore was brought to the ER by her.  Her hemoglobin on arrival was 10.3.  Baseline 12.2 in 12/2016.  This morning, her hemoglobin was 9.5.  She does have chronic macrocytosis.  According to patient's daughter, she did not have any sick contacts other than visiting doctors appointments.  She is on Eliquis for history of A. fib.  Patient denies abdominal pain, nausea, vomiting, melena.  She did have small bowel surgery several years ago. On arrival, patient underwent CT abdomen and pelvis with contrast which revealed thickening of the mid to distal transverse and proximal descending colon consistent with infectious or inflammatory colitis.  No evidence of bowel obstruction and no free air.  She did not have leukocytosis.  She is empirically started on ceftriaxone, metronidazole.  Patient has been hemodynamically stable, she did not have further episodes of bleeding since admission. She is on clear liquid diet and tolerating well  NSAIDs: None  Antiplts/Anticoagulants/Anti thrombotics: Eliquis for history of A. fib  GI Procedures: Not within last 10 years  Past Medical History:    Diagnosis Date  . Cancer ([place])   . CHF (congestive heart failure) ([place])   . Heart valve disorder   . Hypercholesteremia   . Hypertension     History reviewed. No pertinent surgical history.  Prior to Admission medications   Medication Sig Start Date End Date Taking? Authorizing Provider  amLODipine (NORVASC) 5 MG tablet Take 5 mg by mouth daily.   Yes [provider]  apixaban (ELIQUIS) 2.5 MG TABS tablet Take 2.5 mg by mouth daily. 04/15/15  Yes [provider]  cholecalciferol (VITAMIN D3) 25 MCG (1000 UT) tablet Take 1,000 Units by mouth daily.   Yes [provider]  enalapril (VASOTEC) 20 MG tablet Take 20 mg by mouth daily.   Yes [provider]  FLOVENT HFA 220 MCG/ACT inhaler Inhale 2 puffs into the lungs 2 (two) times daily. 10/17/19  Yes [provider]  furosemide (LASIX) 20 MG tablet Take 20 mg by mouth daily.   Yes [provider]  Ipratropium-Albuterol (COMBIVENT RESPIMAT) 20-100 MCG/ACT AERS respimat 1 puff daily as needed.  04/11/16  Yes [provider]  metoprolol tartrate (LOPRESSOR) 25 MG tablet Take 25 mg by mouth 2 (two) times daily. 06/14/16  Yes [provider]  potassium chloride (K-DUR,KLOR-CON) 10 MEQ tablet Take 10 mEq by mouth daily.  08/16/16 12/03/19 Yes [provider]  simvastatin (ZOCOR) 20 MG tablet Take 20 mg by mouth daily.   Yes [provider]    Current Facility-Administered Medications:  .  0.9 %  sodium chloride infusion, , Intravenous, PRN, Wyvonnia Dusky, MD, Stopped at 12/04/19 1019 .  acetaminophen (TYLENOL) tablet 650 mg, 650 mg, Oral, Q6H PRN **OR** acetaminophen (TYLENOL) suppository 650 mg, 650 mg, Rectal, Q6H PRN, Adefeso, Oladapo, DO .  amLODipine (NORVASC) tablet 5 mg, 5 mg, Oral, Daily, Wyvonnia Dusky, MD, 5 mg at 12/04/19 1755 .  budesonide (PULMICORT) nebulizer solution 1 mg, 1 mg, Nebulization, BID, Wyvonnia Dusky, MD .  cefTRIAXone  (ROCEPHIN) 2 g in sodium chloride 0.9 % 100 mL IVPB, 2 g, Intravenous, Q24H, Adefeso, Oladapo, DO .  cholecalciferol (VITAMIN D) tablet 1,000 Units, 1,000 Units, Oral, Daily, Wyvonnia Dusky, MD, 1,000 Units at 12/04/19 1755 .  ipratropium-albuterol (DUONEB) 0.5-2.5 (3) MG/3ML nebulizer solution 3 mL, 3 mL, Inhalation, Daily PRN, Wyvonnia Dusky, MD .  metroNIDAZOLE (FLAGYL) IVPB 500 mg, 500 mg, Intravenous, Q8H, Adefeso, Oladapo, DO, Last Rate: 100 mL/hr at 12/04/19 1803, 500 mg at 12/04/19 1803 .  ondansetron (ZOFRAN) injection 4 mg, 4 mg, Intravenous, Q6H PRN, Adefeso, Oladapo, DO .  [START ON 12/07/2019] pantoprazole (PROTONIX) injection 40 mg, 40 mg, Intravenous, Q12H, [place], Bing Neighbors, NP .  simvastatin (ZOCOR) tablet 20 mg, 20 mg, Oral, Daily, Wyvonnia Dusky, MD, 20 mg at 12/04/19 1755  History reviewed. No pertinent family history.   Social History   Tobacco Use  . Smoking status: Never Smoker  . Smokeless tobacco: Never Used  Substance Use Topics  . Alcohol use: No  . Drug use: Not on file    Allergies as of 12/03/2019 - Review Complete 12/03/2019  Allergen Reaction Noted  . Sulfamethoxazole Swelling 07/23/2014  . Morphine and related  01/01/2017  . Penicillins Hives 01/01/2017    Review of Systems:    All systems reviewed and negative except where noted in HPI.   Physical Exam:  Vital signs in last 24 hours: Temp:  [97.5 F (36.4 C)-98.4 F (36.9 C)] 97.5 F (36.4 C) (12/16 1607) Pulse Rate:  [87-139] 110 (12/16 1607) Resp:  [14-25] 18 (12/15 2245) BP: (102-148)/(69-90) 148/90 (12/16 1607) SpO2:  [94 %-100 %] 94 % (12/16 1607) Weight:  [49.9 kg] 49.9 kg (12/16 0126) Last BM Date: 12/03/19 General:   Pleasant, cooperative in NAD Head:  Normocephalic and atraumatic. Eyes:   No icterus.   Conjunctiva pink. PERRLA. Ears:  Normal auditory acuity. Neck:  Supple; no masses or thyroidomegaly Lungs: Respirations even and unlabored. Lungs clear to  auscultation bilaterally.   No wheezes, crackles, or rhonchi.  Heart:  Regular rate and rhythm;  Without murmur, clicks, rubs or gallops Abdomen:  Soft, mildly distended, nontender. Normal bowel sounds. No appreciable masses or hepatomegaly.  No rebound or guarding.  Rectal:  Not performed. Msk:  Symmetrical without gross deformities.  Strength generalized weakness Extremities:  Without edema, cyanosis or clubbing. Neurologic:  Alert and oriented x2;  grossly normal neurologically. Skin:  Intact without significant lesions or rashes. Psych:  Alert and cooperative. Normal affect.  LAB RESULTS: CBC Latest Ref Rng & Units 12/04/2019 12/03/2019 12/03/2019  WBC 4.0 - 10.5 K/uL 9.0 9.9 9.9  Hemoglobin 12.0 - 15.0 g/dL 9.5(L) 9.2(L) 10.3(L)  Hematocrit 36.0 - 46.0 % 28.9(L) 27.2(L) 30.1(L)  Platelets 150 - 400 K/uL 166 153 186    BMET BMP Latest Ref Rng & Units 12/04/2019 12/03/2019 01/01/2017  Glucose 70 - 99 mg/dL 87 118(H) 115(H)  BUN 8 - 23 mg/dL 19 26(H) 12  Creatinine 0.44 - 1.00 mg/dL 0.71 0.93 0.74  Sodium 135 - 145 mmol/L 136 136 139  Potassium 3.5 - 5.1 mmol/L 3.8 4.0 3.6  Chloride 98 - 111 mmol/L 102 99 101  CO2 22 - 32 mmol/L _0 Calcium 8.9 - 10.3 mg/dL 8.8(L) 9.4 9.9    LFT Hepatic Function Latest Ref Rng & Units 12/04/2019 01/01/2017 01/10/2015  Total Protein 6.5 - 8.1 g/dL 6.2(L) 7.3 5.7(L)  Albumin 3.5 - 5.0 g/dL 3.7 4.0 2.6(L)  AST 15 - 41 U/L _1 ALT 0 - 44 U/L _2 Alk Phosphatase 38 - 126 U/L 52 77 54  Total Bilirubin 0.3 - 1.2 mg/dL 1.1 0.5 0.6     STUDIES: CT ABDOMEN PELVIS W CONTRAST  Result Date: 12/03/2019 CLINICAL DATA:  Diarrhea for 3 days. Bright red blood per rectum today. EXAM: CT ABDOMEN AND PELVIS WITH CONTRAST TECHNIQUE: Multidetector CT imaging of the abdomen and pelvis was performed using the standard protocol following bolus administration of intravenous contrast. CONTRAST:  100 mL OMNIPAQUE IOHEXOL 300 MG/ML  SOLN COMPARISON:   CT abdomen and pelvis 01/05/2015. FINDINGS: Lower chest: Lung bases are emphysematous with basilar atelectasis. There is cardiomegaly. No pleural or pericardial effusion. Hepatobiliary: A few small stones are seen layering dependently in the gallbladder. No evidence of cholecystitis. No focal liver lesion. Biliary tree unremarkable. Pancreas: Unremarkable. No pancreatic ductal dilatation or surrounding inflammatory changes. Spleen: Normal in size without focal abnormality. Adrenals/Urinary Tract: Adrenal glands are unremarkable. Kidneys are normal, without renal calculi, solid lesion, or hydronephrosis. Bladder is unremarkable. Small renal cysts noted. Stomach/Bowel: There is thickening of the walls of the mid to distal transverse and proximal descending colon consistent with infectious or inflammatory colitis. No pneumatosis, portal venous gas or free air. No bowel obstruction. The stomach and small bowel are unremarkable. No evidence of appendicitis. Vascular/Lymphatic: Aortic atherosclerosis. No enlarged abdominal or pelvic lymph nodes. Reproductive: Status post hysterectomy. No adnexal masses. Other: Small volume of free pelvic fluid noted. Musculoskeletal: Biconcave compression fracture of L2 is new since the prior examination but appears remote. No acute or focal abnormality is identified. IMPRESSION: Findings most consistent with infectious or inflammatory colitis of the mid to distal transverse and proximal descending colon. No CT evidence of ischemia. Small volume of free pelvic fluid is likely secondary to colitis. Atherosclerosis. Electronically Signed   By: Inge Rise M.D.   On: 12/03/2019 17:00      Impression / Plan:   Rhonda Mcclure is a 85 y.o. female with hypertension, hyperlipidemia, A. fib on Eliquis admitted with diarrhea and rectal bleeding.  CT revealed thickening of the transverse and descending colon  Diarrhea with rectal bleeding and colitis on the CT scan With history of A.  fib, ischemic colitis is on differential, malignancy needs to be ruled out as well Recommend stool studies to rule out infection If infectious work-up is negative, recommend flexible sigmoidoscopy or colonoscopy on Friday or over the weekend Patient has to be off Eliquis at least for 48 hours before endoscopic evaluation, according to patient's daughter, last dose of Eliquis was yesterday morning  I have discussed alternative options, risks & benefits,  which include, but are not limited to, bleeding, infection, perforation,respiratory complication & drug reaction.  The patient agrees with this plan & written consent will be obtained.     Thank you for involving me in the care of this patient.      LOS: 1 day   Sherri Sear, MD  12/04/2019, 6:37 PM  Note: This dictation was prepared with Dragon dictation along with smaller phrase technology. Any transcriptional errors that result from this process are unintentional.

## 2019-12-04 NOTE — Progress Notes (Signed)
Pt ambulated to BR. She became very SOB and tachycardic (hr as high as 170 in afib rhythm). Pt immediately ambulated back to bed. Hr trended down but still sustaining in the 130s-140s. Pt then c/o chest discomfort (squeezing) that quickly resolved on it's own. Her SOB resolved independently as well. NP notified of CP, SOB, Tachycardia and activity intolerance and came to see the pt personally. Orders for PO metoprolol. Dose given stat and HR is improved (low 100s in afib rhythm). Pt is currently stable with no c/o pain or s/s of discomfort.

## 2019-12-04 NOTE — Progress Notes (Addendum)
PROGRESS NOTE    Rhonda Mcclure  [id number] DOB: 12/14/1923 DOA: 12/03/2019 PCP: [facility] [employer]      Assessment & Plan:   Principal Problem:   Colitis Active Problems:   Essential hypertension   Atrial fibrillation, chronic (HCC)   Diarrhea   Hyperlipidemia   GI bleed   Acute GI bleed: etiology unclear, eliquis use vs colitis. Continue to hold eliquis. CT abd/pelvis showed findings consistent with infectious or inflammatory colitis over the mid to distal transverse and proximal descending colon. Continue on IV ceftriaxone & flagyl. IV zofran prn for nausea/vomiting. Continue on protonix. GI consulted and awaiting recs. Continue to monitor H&H  Acute blood loss anemia & macrocytic anemia: acute blood loss likely secondary to above. B12, folate ordered. No need for a transfusion at this time. Will continue to monitor H&H    Chronic a. fib: continue to hold eliquis secondary to GI bleed. Continue on home dose of metoprolol  Hypomagnesemia: mg sulfate ordered. Will continue to monitor  HTN: will continue on home dose of amlodipine. Will continue to hold home dose of metoprolol, lasix, enalapril as BP is WNL and will reassess tomorrow   HLD: will continue on statin   Vitamin D deficiency: will continue on vitamin D supplements   DVT prophylaxis: SCDs only secondary to GI bleed Code Status: full     Consultants:   GI   Procedures: n/a  Antimicrobials: ceftriaxone & flagyl    Subjective: Pt c/o weakness.   Objective: Vitals:   12/04/19 0126 12/04/19 0430 12/04/19 0537 12/04/19 0802  BP: (!) 145/90 (!) 144/79 120/78 131/90  Pulse: (!) 107 (!) 109 87 (!) 111  Resp:      Temp: 98.4 F (36.9 C) (!) 97.5 F (36.4 C)  97.6 F (36.4 C)  TempSrc: Oral Oral  Oral  SpO2: 96% 98% 95% 94%  Weight: 49.9 kg     Height: 5\' 1"  (1.549 m)       Intake/Output Summary (Last 24 hours) at 12/04/2019 1435 Last data filed at 12/04/2019 [id number] Gross per 24  hour  Intake 205.54 ml  Output 400 ml  Net -194.46 ml   Filed Weights   12/03/19 1221 12/04/19 0126  Weight: 59 kg 49.9 kg    Examination:  General exam: Appears calm and comfortable. Frail appearing  Respiratory system: decreased breath sounds b/l otherwise clear. No rales Cardiovascular system: S1 & S2 present. No rubs, gallops or clicks. No pedal edema. Gastrointestinal system: Abdomen is nondistended, soft and nontender. Normal bowel sounds heard. Central nervous system: Alert and oriented. Moves all 4 extremities Psychiatry: Judgement and insight appear normal. Flat mood and affect.     Data Reviewed: I have personally reviewed following labs and imaging studies  CBC: Recent Labs  Lab 12/03/19 1225 12/03/19 1726 12/04/19 0137  WBC 9.9 9.9 9.0  HGB 10.3* 9.2* 9.5*  HCT 30.1* 27.2* 28.9*  MCV 99.0 99.6 104.7*  PLT 186 153 [id number]   Basic Metabolic Panel: Recent Labs  Lab 12/03/19 1225 12/04/19 0137  NA 136 136  K 4.0 3.8  CL 99 102  CO2 27 25  GLUCOSE 118* 87  BUN 26* 19  CREATININE 0.93 0.71  CALCIUM 9.4 8.8*  MG  --  1.6*  PHOS  --  2.8   GFR: Estimated Creatinine Clearance: 32.4 mL/min (by C-G formula based on SCr of 0.71 mg/dL). Liver Function Tests: Recent Labs  Lab 12/04/19 0137  AST 18  ALT 8  ALKPHOS  52  BILITOT 1.1  PROT 6.2*  ALBUMIN 3.7   No results for input(s): LIPASE, AMYLASE in the last 168 hours. No results for input(s): AMMONIA in the last 168 hours. Coagulation Profile: Recent Labs  Lab 12/04/19 0137  INR 1.6*   Cardiac Enzymes: No results for input(s): CKTOTAL, CKMB, CKMBINDEX, TROPONINI in the last 168 hours. BNP (last 3 results) No results for input(s): PROBNP in the last 8760 hours. HbA1C: No results for input(s): HGBA1C in the last 72 hours. CBG: No results for input(s): GLUCAP in the last 168 hours. Lipid Profile: No results for input(s): CHOL, HDL, LDLCALC, TRIG, CHOLHDL, LDLDIRECT in the last 72 hours. Thyroid  Function Tests: No results for input(s): TSH, T4TOTAL, FREET4, T3FREE, THYROIDAB in the last 72 hours. Anemia Panel: No results for input(s): VITAMINB12, FOLATE, FERRITIN, TIBC, IRON, RETICCTPCT in the last 72 hours. Sepsis Labs: Recent Labs  Lab 12/03/19 1444  LATICACIDVEN 1.4    Recent Results (from the past 240 hour(s))  SARS CORONAVIRUS 2 (TAT 6-24 HRS) Nasopharyngeal Nasopharyngeal Swab     Status: None   Collection Time: 12/03/19  7:35 PM   Specimen: Nasopharyngeal Swab  Result Value Ref Range Status   SARS Coronavirus 2 NEGATIVE NEGATIVE Final    Comment: (NOTE) SARS-CoV-2 target nucleic acids are NOT DETECTED. The SARS-CoV-2 RNA is generally detectable in upper and lower respiratory specimens during the acute phase of infection. Negative results do not preclude SARS-CoV-2 infection, do not rule out co-infections with other pathogens, and should not be used as the sole basis for treatment or other patient management decisions. Negative results must be combined with clinical observations, patient history, and epidemiological information. The expected result is Negative. Fact Sheet for Patients: SugarRoll.be Fact Sheet for Healthcare Providers: https://www.woods-mathews.com/ This test is not yet approved or cleared by the [place] FDA and  has been authorized for detection and/or diagnosis of SARS-CoV-2 by FDA under an Emergency Use Authorization (EUA). This EUA will remain  in effect (meaning this test can be used) for the duration of the COVID-19 declaration under Section 56 4(b)(1) of the Act, 21 U.S.C. section 360bbb-3(b)(1), unless the authorization is terminated or revoked sooner. Performed at [facility], [place] [address], [place], [place] [address]          Radiology Studies: CT ABDOMEN PELVIS W CONTRAST  Result Date: 12/03/2019 CLINICAL DATA:  Diarrhea for 3 days. Bright red blood per rectum today.  EXAM: CT ABDOMEN AND PELVIS WITH CONTRAST TECHNIQUE: Multidetector CT imaging of the abdomen and pelvis was performed using the standard protocol following bolus administration of intravenous contrast. CONTRAST:  100 mL OMNIPAQUE IOHEXOL 300 MG/ML  SOLN COMPARISON:  CT abdomen and pelvis 01/05/2015. FINDINGS: Lower chest: Lung bases are emphysematous with basilar atelectasis. There is cardiomegaly. No pleural or pericardial effusion. Hepatobiliary: A few small stones are seen layering dependently in the gallbladder. No evidence of cholecystitis. No focal liver lesion. Biliary tree unremarkable. Pancreas: Unremarkable. No pancreatic ductal dilatation or surrounding inflammatory changes. Spleen: Normal in size without focal abnormality. Adrenals/Urinary Tract: Adrenal glands are unremarkable. Kidneys are normal, without renal calculi, solid lesion, or hydronephrosis. Bladder is unremarkable. Small renal cysts noted. Stomach/Bowel: There is thickening of the walls of the mid to distal transverse and proximal descending colon consistent with infectious or inflammatory colitis. No pneumatosis, portal venous gas or free air. No bowel obstruction. The stomach and small bowel are unremarkable. No evidence of appendicitis. Vascular/Lymphatic: Aortic atherosclerosis. No enlarged abdominal or pelvic  lymph nodes. Reproductive: Status post hysterectomy. No adnexal masses. Other: Small volume of free pelvic fluid noted. Musculoskeletal: Biconcave compression fracture of L2 is new since the prior examination but appears remote. No acute or focal abnormality is identified. IMPRESSION: Findings most consistent with infectious or inflammatory colitis of the mid to distal transverse and proximal descending colon. No CT evidence of ischemia. Small volume of free pelvic fluid is likely secondary to colitis. Atherosclerosis. Electronically Signed   By: Inge Rise M.D.   On: 12/03/2019 17:00        Scheduled Meds: . [START  ON 12/07/2019] pantoprazole  40 mg Intravenous Q12H   Continuous Infusions: . sodium chloride 1,000 mL (12/04/19 1018)  . cefTRIAXone (ROCEPHIN)  IV    . metronidazole 500 mg (12/04/19 1235)     LOS: 1 day    Time spent: 35 mins   Wyvonnia Dusky, MD [facility] Pager [phone]  If 7PM-7AM, please contact night-coverage [email or web address] Password [email or web address] 12/04/2019, 2:35 PM

## 2019-12-04 NOTE — ED Notes (Signed)
Report given to floor nurse. Pt changed over to hospital bed. Purwik removed. Pts daughter informed on pt moving to 251.

## 2019-12-05 ENCOUNTER — Inpatient Hospital Stay: Payer: Medicare Other

## 2019-12-05 LAB — VITAMIN B12: Vitamin B-12: 379 pg/mL (ref 180–914)

## 2019-12-05 LAB — CBC WITH DIFFERENTIAL/PLATELET
Abs Immature Granulocytes: 0.29 10*3/uL — ABNORMAL HIGH (ref 0.00–0.07)
Basophils Absolute: 0 10*3/uL (ref 0.0–0.1)
Basophils Relative: 1 %
Eosinophils Absolute: 0 10*3/uL (ref 0.0–0.5)
Eosinophils Relative: 1 %
HCT: 30.2 % — ABNORMAL LOW (ref 36.0–46.0)
Hemoglobin: 9.9 g/dL — ABNORMAL LOW (ref 12.0–15.0)
Immature Granulocytes: 5 %
Lymphocytes Relative: 16 %
Lymphs Abs: 0.9 10*3/uL (ref 0.7–4.0)
MCH: 34.4 pg — ABNORMAL HIGH (ref 26.0–34.0)
MCHC: 32.8 g/dL (ref 30.0–36.0)
MCV: 104.9 fL — ABNORMAL HIGH (ref 80.0–100.0)
Monocytes Absolute: 1.5 10*3/uL — ABNORMAL HIGH (ref 0.1–1.0)
Monocytes Relative: 25 %
Neutro Abs: 3.2 10*3/uL (ref 1.7–7.7)
Neutrophils Relative %: 52 %
Platelets: 157 10*3/uL (ref 150–400)
RBC: 2.88 MIL/uL — ABNORMAL LOW (ref 3.87–5.11)
RDW: 19.9 % — ABNORMAL HIGH (ref 11.5–15.5)
WBC: 5.9 10*3/uL (ref 4.0–10.5)
nRBC: 1 % — ABNORMAL HIGH (ref 0.0–0.2)

## 2019-12-05 LAB — BASIC METABOLIC PANEL
Anion gap: 8 (ref 5–15)
BUN: 11 mg/dL (ref 8–23)
CO2: 23 mmol/L (ref 22–32)
Calcium: 9.2 mg/dL (ref 8.9–10.3)
Chloride: 109 mmol/L (ref 98–111)
Creatinine, Ser: 0.57 mg/dL (ref 0.44–1.00)
GFR calc Af Amer: >60 mL/min (ref >60)
GFR calc non Af Amer: >60 mL/min (ref >60)
Glucose, Bld: 111 mg/dL — ABNORMAL HIGH (ref 70–99)
Potassium: 3.7 mmol/L (ref 3.5–5.1)
Sodium: 140 mmol/L (ref 135–145)

## 2019-12-05 LAB — MAGNESIUM: Magnesium: 2.2 mg/dL (ref 1.7–2.4)

## 2019-12-05 LAB — FOLATE: Folate: 21 ng/mL (ref >5.9)

## 2019-12-05 MED ORDER — METOPROLOL TARTRATE 50 MG PO TABS
50.0000 mg | ORAL_TABLET | Freq: Two times a day (BID) | ORAL | Status: DC
  Administered 2019-12-05 – 2019-12-07 (×4): 50 mg via ORAL
  Filled 2019-12-05 (×4): qty 1

## 2019-12-05 MED ORDER — CYANOCOBALAMIN 1000 MCG/ML IJ SOLN
1000.0000 ug | Freq: Once | INTRAMUSCULAR | Status: AC
  Administered 2019-12-05: 12:00:00 1000 ug via INTRAMUSCULAR
  Filled 2019-12-05: qty 1

## 2019-12-05 MED ORDER — METOPROLOL TARTRATE 5 MG/5ML IV SOLN
5.0000 mg | Freq: Two times a day (BID) | INTRAVENOUS | Status: DC | PRN
  Administered 2019-12-05: 5 mg via INTRAVENOUS
  Filled 2019-12-05: qty 5

## 2019-12-05 MED ORDER — DILTIAZEM HCL 25 MG/5ML IV SOLN
5.0000 mg | Freq: Once | INTRAVENOUS | Status: AC
  Administered 2019-12-05: 07:00:00 5 mg via INTRAVENOUS
  Filled 2019-12-05: qty 5

## 2019-12-05 MED ORDER — FUROSEMIDE 10 MG/ML IJ SOLN
40.0000 mg | Freq: Once | INTRAMUSCULAR | Status: AC
  Administered 2019-12-05: 07:00:00 40 mg via INTRAVENOUS
  Filled 2019-12-05: qty 4

## 2019-12-05 MED ORDER — FUROSEMIDE 20 MG PO TABS
20.0000 mg | ORAL_TABLET | Freq: Every day | ORAL | Status: DC
  Administered 2019-12-06 – 2019-12-07 (×2): 20 mg via ORAL
  Filled 2019-12-05 (×2): qty 1

## 2019-12-05 MED ORDER — ENALAPRIL MALEATE 20 MG PO TABS
20.0000 mg | ORAL_TABLET | Freq: Every day | ORAL | Status: DC
  Administered 2019-12-05 – 2019-12-07 (×3): 20 mg via ORAL
  Filled 2019-12-05 (×3): qty 1

## 2019-12-05 MED ORDER — AMLODIPINE BESYLATE 5 MG PO TABS
5.0000 mg | ORAL_TABLET | Freq: Every day | ORAL | Status: DC
  Administered 2019-12-05 – 2019-12-07 (×3): 5 mg via ORAL
  Filled 2019-12-05 (×3): qty 1

## 2019-12-05 NOTE — Progress Notes (Signed)
Rhonda Darby, MD [address]  [place]  [place], [place] [address]  Main: [phone]  Fax: [phone] Pager: [phone]   Subjective: No acute events overnight.  She had one bowel movement yesterday.  She denies abdominal pain, nausea or vomiting.  She is tolerating full liquids well.  Her hemoglobin is stable.   Objective: Vital signs in last 24 hours: Vitals:   12/05/19 1002 12/05/19 1032 12/05/19 1101 12/05/19 1132  BP: (!) 146/74 138/74 (!) 131/98 (!) 151/89  Pulse: 95 (!) 106 (!) 104 (!) 119  Resp:      Temp:      TempSrc:      SpO2: 99% 98% 100% 100%  Weight:      Height:       Weight change:   Intake/Output Summary (Last 24 hours) at 12/05/2019 1508 Last data filed at 12/05/2019 0900 Gross per 24 hour  Intake 150 ml  Output 750 ml  Net -600 ml     Exam: Heart:: Regular rate and rhythm, S1S2 present or without murmur or extra heart sounds Lungs: normal and clear to auscultation Abdomen: soft, nontender, normal bowel sounds   Lab Results: CBC Latest Ref Rng & Units 12/05/2019 12/04/2019 12/03/2019  WBC 4.0 - 10.5 K/uL 5.9 9.0 9.9  Hemoglobin 12.0 - 15.0 g/dL 9.9(L) 9.5(L) 9.2(L)  Hematocrit 36.0 - 46.0 % 30.2(L) 28.9(L) 27.2(L)  Platelets 150 - 400 K/uL 157 166 153   CMP Latest Ref Rng & Units 12/05/2019 12/04/2019 12/03/2019  Glucose 70 - 99 mg/dL 111(H) 87 118(H)  BUN 8 - 23 mg/dL 11 19 26(H)  Creatinine 0.44 - 1.00 mg/dL 0.57 0.71 0.93  Sodium 135 - 145 mmol/L 140 136 136  Potassium 3.5 - 5.1 mmol/L 3.7 3.8 4.0  Chloride 98 - 111 mmol/L 109 102 99  CO2 22 - 32 mmol/L 23 25 27   Calcium 8.9 - 10.3 mg/dL 9.2 8.8(L) 9.4  Total Protein 6.5 - 8.1 g/dL - 6.2(L) -  Total Bilirubin 0.3 - 1.2 mg/dL - 1.1 -  Alkaline Phos 38 - 126 U/L - 52 -  AST 15 - 41 U/L - 18 -  ALT 0 - 44 U/L - 8 -    Micro Results: Recent Results (from the past 240 hour(s))  SARS CORONAVIRUS 2 (TAT 6-24 HRS) Nasopharyngeal Nasopharyngeal Swab     Status: None     Collection Time: 12/03/19  7:35 PM   Specimen: Nasopharyngeal Swab  Result Value Ref Range Status   SARS Coronavirus 2 NEGATIVE NEGATIVE Final    Comment: (NOTE) SARS-CoV-2 target nucleic acids are NOT DETECTED. The SARS-CoV-2 RNA is generally detectable in upper and lower respiratory specimens during the acute phase of infection. Negative results do not preclude SARS-CoV-2 infection, do not rule out co-infections with other pathogens, and should not be used as the sole basis for treatment or other patient management decisions. Negative results must be combined with clinical observations, patient history, and epidemiological information. The expected result is Negative. Fact Sheet for Patients: SugarRoll.be Fact Sheet for Healthcare Providers: https://www.woods-mathews.com/ This test is not yet approved or cleared by the [place] FDA and  has been authorized for detection and/or diagnosis of SARS-CoV-2 by FDA under an Emergency Use Authorization (EUA). This EUA will remain  in effect (meaning this test can be used) for the duration of the COVID-19 declaration under Section 56 4(b)(1) of the Act, 21 U.S.C. section 360bbb-3(b)(1), unless the authorization is terminated or revoked sooner. Performed at  [facility], [place] [address], [place], [place] [address]    Studies/Results: CT ABDOMEN PELVIS W CONTRAST  Result Date: 12/03/2019 CLINICAL DATA:  Diarrhea for 3 days. Bright red blood per rectum today. EXAM: CT ABDOMEN AND PELVIS WITH CONTRAST TECHNIQUE: Multidetector CT imaging of the abdomen and pelvis was performed using the standard protocol following bolus administration of intravenous contrast. CONTRAST:  100 mL OMNIPAQUE IOHEXOL 300 MG/ML  SOLN COMPARISON:  CT abdomen and pelvis 01/05/2015. FINDINGS: Lower chest: Lung bases are emphysematous with basilar atelectasis. There is cardiomegaly. No pleural or pericardial effusion.  Hepatobiliary: A few small stones are seen layering dependently in the gallbladder. No evidence of cholecystitis. No focal liver lesion. Biliary tree unremarkable. Pancreas: Unremarkable. No pancreatic ductal dilatation or surrounding inflammatory changes. Spleen: Normal in size without focal abnormality. Adrenals/Urinary Tract: Adrenal glands are unremarkable. Kidneys are normal, without renal calculi, solid lesion, or hydronephrosis. Bladder is unremarkable. Small renal cysts noted. Stomach/Bowel: There is thickening of the walls of the mid to distal transverse and proximal descending colon consistent with infectious or inflammatory colitis. No pneumatosis, portal venous gas or free air. No bowel obstruction. The stomach and small bowel are unremarkable. No evidence of appendicitis. Vascular/Lymphatic: Aortic atherosclerosis. No enlarged abdominal or pelvic lymph nodes. Reproductive: Status post hysterectomy. No adnexal masses. Other: Small volume of free pelvic fluid noted. Musculoskeletal: Biconcave compression fracture of L2 is new since the prior examination but appears remote. No acute or focal abnormality is identified. IMPRESSION: Findings most consistent with infectious or inflammatory colitis of the mid to distal transverse and proximal descending colon. No CT evidence of ischemia. Small volume of free pelvic fluid is likely secondary to colitis. Atherosclerosis. Electronically Signed   By: Inge Rise M.D.   On: 12/03/2019 17:00   DG Chest Port 1 View  Result Date: 12/05/2019 CLINICAL DATA:  Short of breath EXAM: PORTABLE CHEST 1 VIEW COMPARISON:  2011 FINDINGS: Increased interstitial prominence and patchy opacities. No pleural effusion. Top normal heart size. No pneumothorax. Calcified plaque along the thoracic aorta. IMPRESSION: Interstitial prominence and patchy opacities favored to reflect edema. Electronically Signed   By: Macy Mis M.D.   On: 12/05/2019 08:02   Medications:  I  have reviewed the patient's current medications. Prior to Admission:  Medications Prior to Admission  Medication Sig Dispense Refill Last Dose  . amLODipine (NORVASC) 5 MG tablet Take 5 mg by mouth daily.   12/03/2019 at 0900  . apixaban (ELIQUIS) 2.5 MG TABS tablet Take 2.5 mg by mouth daily.   12/03/2019 at 0900  . cholecalciferol (VITAMIN D3) 25 MCG (1000 UT) tablet Take 1,000 Units by mouth daily.   12/03/2019 at 0900  . enalapril (VASOTEC) 20 MG tablet Take 20 mg by mouth daily.   12/03/2019 at 0900  . FLOVENT HFA 220 MCG/ACT inhaler Inhale 2 puffs into the lungs 2 (two) times daily.   12/03/2019 at 0900  . furosemide (LASIX) 20 MG tablet Take 20 mg by mouth daily.   12/03/2019 at 0900  . Ipratropium-Albuterol (COMBIVENT RESPIMAT) 20-100 MCG/ACT AERS respimat 1 puff daily as needed.    prn at prn  . metoprolol tartrate (LOPRESSOR) 25 MG tablet Take 25 mg by mouth 2 (two) times daily.   12/03/2019 at 0900  . potassium chloride (K-DUR,KLOR-CON) 10 MEQ tablet Take 10 mEq by mouth daily.    12/03/2019 at 0900  . simvastatin (ZOCOR) 20 MG tablet Take 20 mg by mouth daily.   12/03/2019 at 0900  Scheduled: . amLODipine  5 mg Oral Daily  . budesonide  1 mg Nebulization BID  . cholecalciferol  1,000 Units Oral Daily  . enalapril  20 mg Oral Daily  . [START ON 12/06/2019] furosemide  20 mg Oral Daily  . metoprolol tartrate  50 mg Oral BID  . [START ON 12/07/2019] pantoprazole  40 mg Intravenous Q12H  . simvastatin  20 mg Oral Daily   Continuous: . sodium chloride Stopped (12/04/19 1019)  . cefTRIAXone (ROCEPHIN)  IV 2 g (12/04/19 2049)  . metronidazole 500 mg (12/05/19 1227)   [id number] chloride, acetaminophen **OR** acetaminophen, ipratropium-albuterol, metoprolol tartrate, ondansetron (ZOFRAN) IV Anti-infectives (From admission, onward)   Start     Dose/Rate Route Frequency Ordered Stop   12/04/19 1930  cefTRIAXone (ROCEPHIN) 2 g in sodium chloride 0.9 % 100 mL IVPB     2 g 200  mL/hr over 30 Minutes Intravenous Every 24 hours 12/03/19 2135     12/04/19 0300  metroNIDAZOLE (FLAGYL) IVPB 500 mg     500 mg 100 mL/hr over 60 Minutes Intravenous Every 8 hours 12/03/19 2135     12/03/19 1900  cefTRIAXone (ROCEPHIN) 2 g in sodium chloride 0.9 % 100 mL IVPB     2 g 200 mL/hr over 30 Minutes Intravenous  Once 12/03/19 1828 12/03/19 2004   12/03/19 1900  metroNIDAZOLE (FLAGYL) IVPB 500 mg     500 mg 100 mL/hr over 60 Minutes Intravenous  Once 12/03/19 1828 12/03/19 2030     Scheduled Meds: . amLODipine  5 mg Oral Daily  . budesonide  1 mg Nebulization BID  . cholecalciferol  1,000 Units Oral Daily  . enalapril  20 mg Oral Daily  . [START ON 12/06/2019] furosemide  20 mg Oral Daily  . metoprolol tartrate  50 mg Oral BID  . [START ON 12/07/2019] pantoprazole  40 mg Intravenous Q12H  . simvastatin  20 mg Oral Daily   Continuous Infusions: . sodium chloride Stopped (12/04/19 1019)  . cefTRIAXone (ROCEPHIN)  IV 2 g (12/04/19 2049)  . metronidazole 500 mg (12/05/19 1227)   PRN Meds:.sodium chloride, acetaminophen **OR** acetaminophen, ipratropium-albuterol, metoprolol tartrate, ondansetron (ZOFRAN) IV   Assessment: Principal Problem:   Colitis Active Problems:   Essential hypertension   Atrial fibrillation, chronic (HCC)   Diarrhea   Hyperlipidemia   GI bleed  Left-sided colitis, infectious versus ischemic colitis.  Less likely malignancy Overall, clinically improving, hemoglobin is stable.  No recurrent episodes of active hematochezia.  Currently off Eliquis  Plan: Recommend to continue Cipro and Flagyl for 1 week only Advance diet Recommend cardiology consultation to determine long-term need for anticoagulation given her age and recent bleeding event I have again discussed today with patient and her daughter about flexible sigmoidoscopy/colonoscopy to evaluate for malignancy.  She wishes not to proceed with colonoscopy or flexible sigmoidoscopy at this time  as she does not want to know if she has a cancer     LOS: 2 days   Sanuel Ladnier 12/05/2019, 3:08 PM

## 2019-12-05 NOTE — Consult Note (Signed)
[facility] Consultation Note  Patient ID: Rhonda Mcclure, MRN: [id number], DOB/AGE: October 24, 1923 85 y.o. Admit date: 12/03/2019   Date of Consult: 12/05/2019 Primary Physician: [facility], [facility] Primary Cardiologist: Nehemiah Massed  Chief Complaint:  Chief Complaint  Patient presents with  . Diarrhea  . Weakness   Reason for Consult: Atrial fibrillation  HPI: 85 y.o. female with known diastolic dysfunction congestive heart failure hypertension hyperlipidemia valvular heart disease and chronic nonvalvular atrial fibrillation who has been on appropriate medication management for heart rate blood pressure and lower extremity control with no evidence of symptoms.  She had an acute onset of colitis with a GI bleed.  She does have anemia but no need for transfusion of packed red blood cells.  With this the patient had discontinuation of her anticoagulation and had a slowing of her bleed at this time it is completely resolved.  The patient has remained on other medication for good blood pressure control and is reinstating her previous heart rate control medications of metoprolol.  The patient feels well with no evidence of congestive heart failure or anginal symptoms.  We have had a long discussion with the family and the patient about anticoagulation after colitis and the risk of rebleed.  At this time it is highly likely she will have a rebleed until further healing of her colitis and or completely abstaining as well.  At least temporarily the discussion was to abstain from anticoagulation until further evaluation in the office.  Past Medical History:  Diagnosis Date  . Cancer ([place])   . CHF (congestive heart failure) ([place])   . Heart valve disorder   . Hypercholesteremia   . Hypertension       Surgical History: History reviewed. No pertinent surgical history.   Home Meds: Prior to Admission medications   Medication Sig Start Date End Date Taking? Authorizing Provider   amLODipine (NORVASC) 5 MG tablet Take 5 mg by mouth daily.   Yes [provider]  apixaban (ELIQUIS) 2.5 MG TABS tablet Take 2.5 mg by mouth daily. 04/15/15  Yes [provider]  cholecalciferol (VITAMIN D3) 25 MCG (1000 UT) tablet Take 1,000 Units by mouth daily.   Yes [provider]  enalapril (VASOTEC) 20 MG tablet Take 20 mg by mouth daily.   Yes [provider]  FLOVENT HFA 220 MCG/ACT inhaler Inhale 2 puffs into the lungs 2 (two) times daily. 10/17/19  Yes [provider]  furosemide (LASIX) 20 MG tablet Take 20 mg by mouth daily.   Yes [provider]  Ipratropium-Albuterol (COMBIVENT RESPIMAT) 20-100 MCG/ACT AERS respimat 1 puff daily as needed.  04/11/16  Yes [provider]  metoprolol tartrate (LOPRESSOR) 25 MG tablet Take 25 mg by mouth 2 (two) times daily. 06/14/16  Yes [provider]  potassium chloride (K-DUR,KLOR-CON) 10 MEQ tablet Take 10 mEq by mouth daily.  08/16/16 12/03/19 Yes [provider]  simvastatin (ZOCOR) 20 MG tablet Take 20 mg by mouth daily.   Yes [provider]    Inpatient Medications:  . amLODipine  5 mg Oral Daily  . budesonide  1 mg Nebulization BID  . cholecalciferol  1,000 Units Oral Daily  . enalapril  20 mg Oral Daily  . [START ON 12/06/2019] furosemide  20 mg Oral Daily  . metoprolol tartrate  50 mg Oral BID  . [START ON 12/07/2019] pantoprazole  40 mg Intravenous Q12H  . simvastatin  20 mg Oral Daily   . sodium chloride  Stopped (12/04/19 1019)  . cefTRIAXone (ROCEPHIN)  IV 2 g (12/04/19 2049)  . metronidazole 500 mg (12/05/19 1227)    Allergies:  Allergies  Allergen Reactions  . Sulfamethoxazole Swelling    Pt had thick tongue and felt disoriented Pt had thick tongue and felt disoriented   . Morphine And Related   . Penicillins Hives    Per patient, total body hives. No SOB noted. Young adulthood. Per daughter, who reported that at some point her  mother was given a combination form of PCN and did okay with it. However, the daughter is unable to remember the name of the medication.     Social History   Socioeconomic History  . Marital status: Widowed    Spouse name: Not on file  . Number of children: Not on file  . Years of education: Not on file  . Highest education level: Not on file  Occupational History  . Not on file  Tobacco Use  . Smoking status: Never Smoker  . Smokeless tobacco: Never Used  Substance and Sexual Activity  . Alcohol use: No  . Drug use: Not on file  . Sexual activity: Not on file  Other Topics Concern  . Not on file  Social History Narrative  . Not on file   Social Determinants of Health   Financial Resource Strain:   . Difficulty of Paying Living Expenses: Not on file  Food Insecurity:   . Worried About Charity fundraiser in the Last Year: Not on file  . Ran Out of Food in the Last Year: Not on file  Transportation Needs:   . Lack of Transportation (Medical): Not on file  . Lack of Transportation (Non-Medical): Not on file  Physical Activity:   . Days of Exercise per Week: Not on file  . Minutes of Exercise per Session: Not on file  Stress:   . Feeling of Stress : Not on file  Social Connections:   . Frequency of Communication with Friends and Family: Not on file  . Frequency of Social Gatherings with Friends and Family: Not on file  . Attends Religious Services: Not on file  . Active Member of Clubs or Organizations: Not on file  . Attends Archivist Meetings: Not on file  . Marital Status: Not on file  Intimate Partner Violence:   . Fear of Current or Ex-Partner: Not on file  . Emotionally Abused: Not on file  . Physically Abused: Not on file  . Sexually Abused: Not on file     History reviewed. No pertinent family history.   Review of Systems Positive for GI bleed shortness of breath Negative for: General:  chills, fever, night sweats or weight changes.   Cardiovascular: PND orthopnea syncope dizziness  Dermatological skin lesions rashes Respiratory: Cough congestion Urologic: Frequent urination urination at night and hematuria Abdominal: negative for nausea, vomiting, diarrhea, positive for bright red blood per rectum, negative for melena, or hematemesis Neurologic: negative for visual changes, and/or hearing changes  All other systems reviewed and are otherwise negative except as noted above.  Labs: No results for input(s): CKTOTAL, CKMB, TROPONINI in the last 72 hours. Lab Results  Component Value Date   WBC 5.9 12/05/2019   HGB 9.9 (L) 12/05/2019   HCT 30.2 (L) 12/05/2019   MCV 104.9 (H) 12/05/2019   PLT 157 12/05/2019    Recent Labs  Lab 12/04/19 0137 12/05/19 0643  NA 136 140  K 3.8 3.7  CL 102 109  CO2 25 23  BUN 19 11  CREATININE 0.71 0.57  CALCIUM 8.8* 9.2  PROT 6.2*  --   BILITOT 1.1  --   ALKPHOS 52  --   ALT 8  --   AST 18  --   GLUCOSE 87 111*   No results found for: CHOL, HDL, LDLCALC, TRIG No results found for: DDIMER  Radiology/Studies:  CT ABDOMEN PELVIS W CONTRAST  Result Date: 12/03/2019 CLINICAL DATA:  Diarrhea for 3 days. Bright red blood per rectum today. EXAM: CT ABDOMEN AND PELVIS WITH CONTRAST TECHNIQUE: Multidetector CT imaging of the abdomen and pelvis was performed using the standard protocol following bolus administration of intravenous contrast. CONTRAST:  100 mL OMNIPAQUE IOHEXOL 300 MG/ML  SOLN COMPARISON:  CT abdomen and pelvis 01/05/2015. FINDINGS: Lower chest: Lung bases are emphysematous with basilar atelectasis. There is cardiomegaly. No pleural or pericardial effusion. Hepatobiliary: A few small stones are seen layering dependently in the gallbladder. No evidence of cholecystitis. No focal liver lesion. Biliary tree unremarkable. Pancreas: Unremarkable. No pancreatic ductal dilatation or surrounding inflammatory changes. Spleen: Normal in size without focal abnormality.  Adrenals/Urinary Tract: Adrenal glands are unremarkable. Kidneys are normal, without renal calculi, solid lesion, or hydronephrosis. Bladder is unremarkable. Small renal cysts noted. Stomach/Bowel: There is thickening of the walls of the mid to distal transverse and proximal descending colon consistent with infectious or inflammatory colitis. No pneumatosis, portal venous gas or free air. No bowel obstruction. The stomach and small bowel are unremarkable. No evidence of appendicitis. Vascular/Lymphatic: Aortic atherosclerosis. No enlarged abdominal or pelvic lymph nodes. Reproductive: Status post hysterectomy. No adnexal masses. Other: Small volume of free pelvic fluid noted. Musculoskeletal: Biconcave compression fracture of L2 is new since the prior examination but appears remote. No acute or focal abnormality is identified. IMPRESSION: Findings most consistent with infectious or inflammatory colitis of the mid to distal transverse and proximal descending colon. No CT evidence of ischemia. Small volume of free pelvic fluid is likely secondary to colitis. Atherosclerosis. Electronically Signed   By: Inge Rise M.D.   On: 12/03/2019 17:00   DG Chest Port 1 View  Result Date: 12/05/2019 CLINICAL DATA:  Short of breath EXAM: PORTABLE CHEST 1 VIEW COMPARISON:  2011 FINDINGS: Increased interstitial prominence and patchy opacities. No pleural effusion. Top normal heart size. No pneumothorax. Calcified plaque along the thoracic aorta. IMPRESSION: Interstitial prominence and patchy opacities favored to reflect edema. Electronically Signed   By: Macy Mis M.D.   On: 12/05/2019 08:02    EKG: Atrial fibrillation with rapid ventricular rate nonspecific ST changes  Weights: Filed Weights   12/03/19 1221 12/04/19 0126  Weight: 59 kg 49.9 kg     Physical Exam: Blood pressure (!) 151/89, pulse (!) 119, temperature 97.8 F (36.6 C), temperature source Oral, resp. rate 20, height 5\' 1"  (1.549 m), weight  49.9 kg, SpO2 100 %. Body mass index is 20.78 kg/m. General: Well developed, well nourished, in no acute distress. Head eyes ears nose throat: Normocephalic, atraumatic, sclera non-icteric, no xanthomas, nares are without discharge. No apparent thyromegaly and/or mass  Lungs: Normal respiratory effort.  no wheezes, no rales, no rhonchi.  Heart: Irregular with normal S1 S2.  Left sternal border murmur gallop, no rub, PMI is normal size and placement, carotid upstroke normal without bruit, jugular venous pressure is normal Abdomen: Soft, non-tender, non-distended with normoactive bowel sounds. No hepatomegaly. No rebound/guarding. No obvious abdominal masses. Abdominal aorta is normal size without bruit Extremities: Trace edema. no cyanosis,  no clubbing, no ulcers  Peripheral : 2+ bilateral upper extremity pulses, 2+ bilateral femoral pulses, 2+ bilateral dorsal pedal pulse Neuro: Alert and oriented. No facial asymmetry. No focal deficit. Moves all extremities spontaneously. Musculoskeletal: Normal muscle tone without kyphosis Psych:  Responds to questions appropriately with a normal affect.    Assessment: 85 year old female with hypertension hyperlipidemia valvular heart disease and permanent nonvalvular atrial fibrillation with rapid ventricular rate due to recent colitis and bleed now more controlled with resolution of bleeding and stable without evidence of myocardial infarction or congestive heart failure  Plan: 1.  Reinstatement of heart rate control medication management with a goal heart rate control below 100 bpm with metoprolol 2.  No further cardiac diagnostics necessary at this time due to no evidence of myocardial infarction or congestive heart failure 3.  No use of anticoagulation at this time due to concerns of continued colitis and bleeding and would allow minimum of 2 weeks of healing before reconsideration of possibility of anticoagulation and this can be done as an outpatient 4.   No change in hypertension control and further treatment with furosemide for chronic diastolic dysfunction congestive heart failure 5.  Begin ambulation and follow for improvements of symptoms and okay for discharge home from cardiac standpoint with above with follow-up in 1 to 2 weeks  Signed, Corey Skains M.D. [facility] 12/05/2019, 2:14 PM

## 2019-12-05 NOTE — Progress Notes (Addendum)
PROGRESS NOTE    Rhonda Mcclure  [id number] DOB: 09-04-1923 DOA: 12/03/2019 PCP: [facility] [employer]      Assessment & Plan:   Principal Problem:   Colitis Active Problems:   Essential hypertension   Atrial fibrillation, chronic (HCC)   Diarrhea   Hyperlipidemia   GI bleed   Acute GI bleed: etiology unclear, eliquis use vs colitis. Continue to hold eliquis. CT abd/pelvis showed findings consistent with infectious or inflammatory colitis over the mid to distal transverse and proximal descending colon. Continue on IV ceftriaxone & flagyl. IV zofran prn for nausea/vomiting. Continue on protonix. GI following & recs apprec. Possible flexible sigmoidoscopy or colonoscopy on friday or over the weekend but pt does not want any procedures. Will consult cardio to determine if anticoagulation should be continued or not. Continue to monitor H&H  Acute blood loss anemia & macrocytic anemia: acute blood loss likely secondary to above. Folate & B12 are both WNL. H&H are stable. Will continue to monitor H&H    Chronic a. fib: w/ RVR currently. Continue to hold eliquis secondary to GI bleed. Continue on increased dose of metoprolol. IV diltiazem x 1. Continue on tele   CHF: unknown systolic vs diastolic vs combined. No echo. Will give IV lasix x1 and restart home dose of lasix. CXR ordered.   Hypomagnesemia: repleated. Will continue to monitor   HTN: will continue on amlodipine, metoprolol, lasix, & enalapril.   HLD: will continue on statin   Vitamin D deficiency: will continue on vitamin D supplements   DVT prophylaxis: SCDs only secondary to GI bleed Code Status: full     Consultants:   GI  cardio   Procedures: n/a  Antimicrobials: ceftriaxone & flagyl   Family communication: called pt's daughter cell phone but no answer, will try to call again later  Subjective: Pt c/o fatigue   Objective: Vitals:   12/04/19 2245 12/05/19 0340 12/05/19 0703 12/05/19 0724    BP: 128/74 (!) 141/90 (!) 167/124 (!) 144/104  Pulse: 86 100 79 (!) 113  Resp:  20    Temp:  98.6 F (37 C)    TempSrc:      SpO2: 94% 94% 91% 97%  Weight:      Height:        Intake/Output Summary (Last 24 hours) at 12/05/2019 0743 Last data filed at 12/05/2019 0000 Gross per 24 hour  Intake 150 ml  Output 450 ml  Net -300 ml   Filed Weights   12/03/19 1221 12/04/19 0126  Weight: 59 kg 49.9 kg    Examination:  General exam: Appears calm and comfortable. Frail appearing  Respiratory system: diminished breath sounds b/l otherwise clear. No rales Cardiovascular system: irregularly irregular. No rubs, gallops or clicks. Gastrointestinal system: Abdomen is nondistended, soft and nontender. Normal bowel sounds heard. Central nervous system: Alert and oriented. Moves all 4 extremities Psychiatry: Judgement and insight appear normal. Flat mood and affect.     Data Reviewed: I have personally reviewed following labs and imaging studies  CBC: Recent Labs  Lab 12/03/19 1225 12/03/19 1726 12/04/19 0137 12/05/19 0643  WBC 9.9 9.9 9.0 5.9  NEUTROABS  --   --   --  3.2  HGB 10.3* 9.2* 9.5* 9.9*  HCT 30.1* 27.2* 28.9* 30.2*  MCV 99.0 99.6 104.7* 104.9*  PLT 186 153 166 [id number]   Basic Metabolic Panel: Recent Labs  Lab 12/03/19 1225 12/04/19 0137  NA 136 136  K 4.0 3.8  CL 99 102  CO2 27 25  GLUCOSE 118* 87  BUN 26* 19  CREATININE 0.93 0.71  CALCIUM 9.4 8.8*  MG  --  1.6*  PHOS  --  2.8   GFR: Estimated Creatinine Clearance: 32.4 mL/min (by C-G formula based on SCr of 0.71 mg/dL). Liver Function Tests: Recent Labs  Lab 12/04/19 0137  AST 18  ALT 8  ALKPHOS 52  BILITOT 1.1  PROT 6.2*  ALBUMIN 3.7   No results for input(s): LIPASE, AMYLASE in the last 168 hours. No results for input(s): AMMONIA in the last 168 hours. Coagulation Profile: Recent Labs  Lab 12/04/19 0137  INR 1.6*   Cardiac Enzymes: No results for input(s): CKTOTAL, CKMB, CKMBINDEX,  TROPONINI in the last 168 hours. BNP (last 3 results) No results for input(s): PROBNP in the last 8760 hours. HbA1C: No results for input(s): HGBA1C in the last 72 hours. CBG: No results for input(s): GLUCAP in the last 168 hours. Lipid Profile: No results for input(s): CHOL, HDL, LDLCALC, TRIG, CHOLHDL, LDLDIRECT in the last 72 hours. Thyroid Function Tests: No results for input(s): TSH, T4TOTAL, FREET4, T3FREE, THYROIDAB in the last 72 hours. Anemia Panel: Recent Labs    12/04/19 0136 12/04/19 0137  VITAMINB12 316  --   FOLATE  --  21.9  FERRITIN  --  428*  TIBC  --  245*  IRON  --  33   Sepsis Labs: Recent Labs  Lab 12/03/19 1444  LATICACIDVEN 1.4    Recent Results (from the past 240 hour(s))  SARS CORONAVIRUS 2 (TAT 6-24 HRS) Nasopharyngeal Nasopharyngeal Swab     Status: None   Collection Time: 12/03/19  7:35 PM   Specimen: Nasopharyngeal Swab  Result Value Ref Range Status   SARS Coronavirus 2 NEGATIVE NEGATIVE Final    Comment: (NOTE) SARS-CoV-2 target nucleic acids are NOT DETECTED. The SARS-CoV-2 RNA is generally detectable in upper and lower respiratory specimens during the acute phase of infection. Negative results do not preclude SARS-CoV-2 infection, do not rule out co-infections with other pathogens, and should not be used as the sole basis for treatment or other patient management decisions. Negative results must be combined with clinical observations, patient history, and epidemiological information. The expected result is Negative. Fact Sheet for Patients: SugarRoll.be Fact Sheet for Healthcare Providers: https://www.woods-mathews.com/ This test is not yet approved or cleared by the [place] FDA and  has been authorized for detection and/or diagnosis of SARS-CoV-2 by FDA under an Emergency Use Authorization (EUA). This EUA will remain  in effect (meaning this test can be used) for the duration of  the COVID-19 declaration under Section 56 4(b)(1) of the Act, 21 U.S.C. section 360bbb-3(b)(1), unless the authorization is terminated or revoked sooner. Performed at [facility], [place] [address], [place], [place] [address]          Radiology Studies: CT ABDOMEN PELVIS W CONTRAST  Result Date: 12/03/2019 CLINICAL DATA:  Diarrhea for 3 days. Bright red blood per rectum today. EXAM: CT ABDOMEN AND PELVIS WITH CONTRAST TECHNIQUE: Multidetector CT imaging of the abdomen and pelvis was performed using the standard protocol following bolus administration of intravenous contrast. CONTRAST:  100 mL OMNIPAQUE IOHEXOL 300 MG/ML  SOLN COMPARISON:  CT abdomen and pelvis 01/05/2015. FINDINGS: Lower chest: Lung bases are emphysematous with basilar atelectasis. There is cardiomegaly. No pleural or pericardial effusion. Hepatobiliary: A few small stones are seen layering dependently in the gallbladder. No evidence of cholecystitis. No focal liver lesion. Biliary tree unremarkable. Pancreas: Unremarkable.  No pancreatic ductal dilatation or surrounding inflammatory changes. Spleen: Normal in size without focal abnormality. Adrenals/Urinary Tract: Adrenal glands are unremarkable. Kidneys are normal, without renal calculi, solid lesion, or hydronephrosis. Bladder is unremarkable. Small renal cysts noted. Stomach/Bowel: There is thickening of the walls of the mid to distal transverse and proximal descending colon consistent with infectious or inflammatory colitis. No pneumatosis, portal venous gas or free air. No bowel obstruction. The stomach and small bowel are unremarkable. No evidence of appendicitis. Vascular/Lymphatic: Aortic atherosclerosis. No enlarged abdominal or pelvic lymph nodes. Reproductive: Status post hysterectomy. No adnexal masses. Other: Small volume of free pelvic fluid noted. Musculoskeletal: Biconcave compression fracture of L2 is new since the prior examination but appears remote. No acute  or focal abnormality is identified. IMPRESSION: Findings most consistent with infectious or inflammatory colitis of the mid to distal transverse and proximal descending colon. No CT evidence of ischemia. Small volume of free pelvic fluid is likely secondary to colitis. Atherosclerosis. Electronically Signed   By: Inge Rise M.D.   On: 12/03/2019 17:00        Scheduled Meds:  budesonide  1 mg Nebulization BID   cholecalciferol  1,000 Units Oral Daily   metoprolol tartrate  25 mg Oral BID   [START ON 12/07/2019] pantoprazole  40 mg Intravenous Q12H   simvastatin  20 mg Oral Daily   Continuous Infusions:  sodium chloride Stopped (12/04/19 1019)   cefTRIAXone (ROCEPHIN)  IV 2 g (12/04/19 2049)   metronidazole 500 mg (12/05/19 0305)     LOS: 2 days    Time spent: 36 mins   Wyvonnia Dusky, MD [facility] Pager [phone]  If 7PM-7AM, please contact night-coverage [email or web address] Password [facility] 12/05/2019, 7:43 AM

## 2019-12-06 LAB — CBC WITH DIFFERENTIAL/PLATELET
Abs Immature Granulocytes: 0.34 10*3/uL — ABNORMAL HIGH (ref 0.00–0.07)
Basophils Absolute: 0 10*3/uL (ref 0.0–0.1)
Basophils Relative: 0 %
Eosinophils Absolute: 0 10*3/uL (ref 0.0–0.5)
Eosinophils Relative: 0 %
HCT: 28.2 % — ABNORMAL LOW (ref 36.0–46.0)
Hemoglobin: 9.2 g/dL — ABNORMAL LOW (ref 12.0–15.0)
Immature Granulocytes: 5 %
Lymphocytes Relative: 15 %
Lymphs Abs: 1.1 10*3/uL (ref 0.7–4.0)
MCH: 34.1 pg — ABNORMAL HIGH (ref 26.0–34.0)
MCHC: 32.6 g/dL (ref 30.0–36.0)
MCV: 104.4 fL — ABNORMAL HIGH (ref 80.0–100.0)
Monocytes Absolute: 1.9 10*3/uL — ABNORMAL HIGH (ref 0.1–1.0)
Monocytes Relative: 28 %
Neutro Abs: 3.6 10*3/uL (ref 1.7–7.7)
Neutrophils Relative %: 52 %
Platelets: 125 10*3/uL — ABNORMAL LOW (ref 150–400)
RBC: 2.7 MIL/uL — ABNORMAL LOW (ref 3.87–5.11)
RDW: 19.9 % — ABNORMAL HIGH (ref 11.5–15.5)
WBC: 7 10*3/uL (ref 4.0–10.5)
nRBC: 1 % — ABNORMAL HIGH (ref 0.0–0.2)

## 2019-12-06 LAB — URINALYSIS, COMPLETE (UACMP) WITH MICROSCOPIC
Bacteria, UA: NONE SEEN
Bilirubin Urine: NEGATIVE
Glucose, UA: NEGATIVE mg/dL
Hgb urine dipstick: NEGATIVE
Ketones, ur: NEGATIVE mg/dL
Nitrite: NEGATIVE
Protein, ur: NEGATIVE mg/dL
Specific Gravity, Urine: 1.006 (ref 1.005–1.030)
Squamous Epithelial / HPF: NONE SEEN (ref 0–5)
pH: 6 (ref 5.0–8.0)

## 2019-12-06 LAB — BASIC METABOLIC PANEL
Anion gap: 10 (ref 5–15)
BUN: 13 mg/dL (ref 8–23)
CO2: 25 mmol/L (ref 22–32)
Calcium: 9 mg/dL (ref 8.9–10.3)
Chloride: 103 mmol/L (ref 98–111)
Creatinine, Ser: 0.69 mg/dL (ref 0.44–1.00)
GFR calc Af Amer: >60 mL/min (ref >60)
GFR calc non Af Amer: >60 mL/min (ref >60)
Glucose, Bld: 103 mg/dL — ABNORMAL HIGH (ref 70–99)
Potassium: 3.6 mmol/L (ref 3.5–5.1)
Sodium: 138 mmol/L (ref 135–145)

## 2019-12-06 LAB — MAGNESIUM: Magnesium: 1.7 mg/dL (ref 1.7–2.4)

## 2019-12-06 MED ORDER — SODIUM CHLORIDE 0.9% FLUSH
3.0000 mL | INTRAVENOUS | Status: DC | PRN
  Administered 2019-12-06: 3 mL via INTRAVENOUS

## 2019-12-06 MED ORDER — SODIUM CHLORIDE 0.9% FLUSH
3.0000 mL | Freq: Two times a day (BID) | INTRAVENOUS | Status: DC
  Administered 2019-12-06 – 2019-12-07 (×3): 3 mL via INTRAVENOUS

## 2019-12-06 NOTE — Progress Notes (Signed)
PROGRESS NOTE    Rhonda Mcclure  [id number] DOB: 08/13/1923 DOA: 12/03/2019 PCP: [facility] [employer]      Assessment & Plan:   Principal Problem:   Colitis Active Problems:   Essential hypertension   Atrial fibrillation, chronic (HCC)   Diarrhea   Hyperlipidemia   GI bleed   Acute GI bleed: etiology unclear, eliquis use vs colitis. Continue to hold eliquis. CT abd/pelvis showed findings consistent with infectious or inflammatory colitis over the mid to distal transverse and proximal descending colon. Continue on IV ceftriaxone & flagyl. IV zofran prn for nausea/vomiting. Continue on protonix. GI following & recs apprec. Possible flexible sigmoidoscopy or colonoscopy on friday or over the weekend but pt does not want any procedures.Continue to monitor H&H  Colitis: infectious vs ischemic. H&H are stable. Continue on IV ceftriaxone & flagyl. Pt did not want any procedures including flex sig or colonoscopy  Acute blood loss anemia & macrocytic anemia: acute blood loss likely secondary to above. Folate & B12 are both WNL. H&H are stable. Will continue to monitor H&H    Chronic a. Fib: continue to hold eliquis secondary to GI bleed and cardio will decide as an outpatient whether or not to restart. Continue on increased dose of metoprolol. Continue on tele   Chronic diastolic CHF: as per cardio. Continue on home dose of lasix. Monitor I/Os.  Hypomagnesemia: WNL. Will continue to monitor   HTN: will continue on amlodipine, metoprolol, lasix, & enalapril.   HLD: will continue on statin   Vitamin D deficiency: will continue on vitamin D supplements   Generalized weakness: PT/OT consulted  DVT prophylaxis: SCDs only secondary to GI bleed Code Status: full     Consultants:   GI  cardio   Procedures: n/a  Antimicrobials: ceftriaxone & flagyl   Family communication: discussed pt's care with pt's daughter who is at bedside and answered all of her  questions  Subjective: Pt c/o loose stools  Objective: Vitals:   12/05/19 1845 12/05/19 1939 12/05/19 2041 12/06/19 0440  BP:  125/65  139/76  Pulse:  (!) 103  99  Resp:  20  20  Temp:  97.9 F (36.6 C)  98 F (36.7 C)  TempSrc:  Oral  Oral  SpO2: 100% 98% 96% 98%  Weight:      Height:        Intake/Output Summary (Last 24 hours) at 12/06/2019 0818 Last data filed at 12/06/2019 0522 Gross per 24 hour  Intake 410.14 ml  Output 500 ml  Net -89.86 ml   Filed Weights   12/03/19 1221 12/04/19 0126  Weight: 59 kg 49.9 kg    Examination:  General exam: Appears calm and comfortable. Frail appearing  Respiratory system: decreased breath sounds b/l otherwise clear. No rales Cardiovascular system: irregularly irregular. No rubs, gallops or clicks. Gastrointestinal system: Abdomen is nondistended, soft and nontender. Hyperactive bowel sounds heard. Central nervous system: Alert and oriented. Moves all 4 extremities Psychiatry: Judgement and insight appear normal. Normal mood and affect.     Data Reviewed: I have personally reviewed following labs and imaging studies  CBC: Recent Labs  Lab 12/03/19 1225 12/03/19 1726 12/04/19 0137 12/05/19 0643  WBC 9.9 9.9 9.0 5.9  NEUTROABS  --   --   --  3.2  HGB 10.3* 9.2* 9.5* 9.9*  HCT 30.1* 27.2* 28.9* 30.2*  MCV 99.0 99.6 104.7* 104.9*  PLT 186 153 166 [id number]   Basic Metabolic Panel: Recent Labs  Lab 12/03/19 1225  12/04/19 0137 12/05/19 0643  NA 136 136 140  K 4.0 3.8 3.7  CL 99 102 109  CO2 27 25 23   GLUCOSE 118* 87 111*  BUN 26* 19 11  CREATININE 0.93 0.71 0.57  CALCIUM 9.4 8.8* 9.2  MG  --  1.6* 2.2  PHOS  --  2.8  --    GFR: Estimated Creatinine Clearance: 32.4 mL/min (by C-G formula based on SCr of 0.57 mg/dL). Liver Function Tests: Recent Labs  Lab 12/04/19 0137  AST 18  ALT 8  ALKPHOS 52  BILITOT 1.1  PROT 6.2*  ALBUMIN 3.7   No results for input(s): LIPASE, AMYLASE in the last 168 hours. No  results for input(s): AMMONIA in the last 168 hours. Coagulation Profile: Recent Labs  Lab 12/04/19 0137  INR 1.6*   Cardiac Enzymes: No results for input(s): CKTOTAL, CKMB, CKMBINDEX, TROPONINI in the last 168 hours. BNP (last 3 results) No results for input(s): PROBNP in the last 8760 hours. HbA1C: No results for input(s): HGBA1C in the last 72 hours. CBG: No results for input(s): GLUCAP in the last 168 hours. Lipid Profile: No results for input(s): CHOL, HDL, LDLCALC, TRIG, CHOLHDL, LDLDIRECT in the last 72 hours. Thyroid Function Tests: No results for input(s): TSH, T4TOTAL, FREET4, T3FREE, THYROIDAB in the last 72 hours. Anemia Panel: Recent Labs    12/04/19 0136 12/04/19 0137 12/05/19 0643  VITAMINB12 316  --  379  FOLATE  --  21.9 21.0  FERRITIN  --  428*  --   TIBC  --  245*  --   IRON  --  33  --    Sepsis Labs: Recent Labs  Lab 12/03/19 1444  LATICACIDVEN 1.4    Recent Results (from the past 240 hour(s))  SARS CORONAVIRUS 2 (TAT 6-24 HRS) Nasopharyngeal Nasopharyngeal Swab     Status: None   Collection Time: 12/03/19  7:35 PM   Specimen: Nasopharyngeal Swab  Result Value Ref Range Status   SARS Coronavirus 2 NEGATIVE NEGATIVE Final    Comment: (NOTE) SARS-CoV-2 target nucleic acids are NOT DETECTED. The SARS-CoV-2 RNA is generally detectable in upper and lower respiratory specimens during the acute phase of infection. Negative results do not preclude SARS-CoV-2 infection, do not rule out co-infections with other pathogens, and should not be used as the sole basis for treatment or other patient management decisions. Negative results must be combined with clinical observations, patient history, and epidemiological information. The expected result is Negative. Fact Sheet for Patients: SugarRoll.be Fact Sheet for Healthcare Providers: https://www.woods-mathews.com/ This test is not yet approved or cleared by the  [place] FDA and  has been authorized for detection and/or diagnosis of SARS-CoV-2 by FDA under an Emergency Use Authorization (EUA). This EUA will remain  in effect (meaning this test can be used) for the duration of the COVID-19 declaration under Section 56 4(b)(1) of the Act, 21 U.S.C. section 360bbb-3(b)(1), unless the authorization is terminated or revoked sooner. Performed at [facility], [place] [address], [place], [place] [address]          Radiology Studies: DG Chest Port 1 View  Result Date: 12/05/2019 CLINICAL DATA:  Short of breath EXAM: PORTABLE CHEST 1 VIEW COMPARISON:  2011 FINDINGS: Increased interstitial prominence and patchy opacities. No pleural effusion. Top normal heart size. No pneumothorax. Calcified plaque along the thoracic aorta. IMPRESSION: Interstitial prominence and patchy opacities favored to reflect edema. Electronically Signed   By: Macy Mis M.D.   On: 12/05/2019  08:02        Scheduled Meds: . amLODipine  5 mg Oral Daily  . budesonide  1 mg Nebulization BID  . cholecalciferol  1,000 Units Oral Daily  . enalapril  20 mg Oral Daily  . furosemide  20 mg Oral Daily  . metoprolol tartrate  50 mg Oral BID  . [START ON 12/07/2019] pantoprazole  40 mg Intravenous Q12H  . simvastatin  20 mg Oral Daily  . sodium chloride flush  3 mL Intravenous Q12H   Continuous Infusions: . sodium chloride Stopped (12/06/19 0519)  . cefTRIAXone (ROCEPHIN)  IV Stopped (12/05/19 2056)  . metronidazole Stopped (12/06/19 0414)     LOS: 3 days    Time spent: 30 mins   Wyvonnia Dusky, MD [facility] Pager [phone]  If 7PM-7AM, please contact night-coverage [email or web address] Password [facility] 12/06/2019, 8:18 AM

## 2019-12-06 NOTE — Evaluation (Signed)
Occupational Therapy Evaluation Patient Details Name: Rhonda Mcclure MRN: [id number] DOB: 04-25-1923 Today's Date: 12/06/2019    History of Present Illness Pt is a 85 y.o. female presenting to hospital 12/03/19 with diarrhea, weakness, and rectal bleeding.  Pt admitted with acute GI bleed in setting of acute diarrhea d/t acute colitis.  PMH includes CHF, htn, heart valve disorder.   Clinical Impression   Pt seen for OT evaluation this date. Pt required minimal assist for bathing, socks/shoes, meal prep, cleaning, and medication mgt from aide who comes M-F for 6 hrs/day. Ambulated with 4WW about 30' max at home before getting "winded." Pt not on supplemental O2 at home prior to admission. Dtr reports that pt becoming easily fatigued or out of breath with minimize exertion. Pt currently requires minimal assist for LB ADL, additional time to complete, CGA for short distance functional mobility due to poor activity tolerance, strength, and cardiopulmonary status. Currently on 2L supplemental O2. Pt/dtr instructed in energy conservation strategies including pursed lip breathing and use of shower chair to take partially seated shower to decreased energy use and minimize over exertion. Therapist will provide a handout of instruction next session to support recall and carryover. Pt would benefit from additional skilled OT services to maximize recall and carryover of learned techniques and facilitate implementation of learned techniques into daily routines. Upon discharge, recommend [place] services.      Follow Up Recommendations  Home health OT;Supervision/Assistance - 24 hour    Equipment Recommendations  None recommended by OT    Recommendations for Other Services       Precautions / Restrictions Precautions Precautions: Fall Restrictions Weight Bearing Restrictions: No      Mobility Bed Mobility Overal bed mobility: Modified Independent             General bed mobility comments:  deferred, up in recliner  Transfers Overall transfer level: Needs assistance Equipment used: Rolling walker (2 wheeled) Transfers: Sit to/from Stand Sit to Stand: Min guard         General transfer comment: mild increased effort to stand from bed but overall steady and safe    Balance Overall balance assessment: Needs assistance Sitting-balance support: No upper extremity supported;Feet supported Sitting balance-Leahy Scale: Good Sitting balance - Comments: steady sitting reaching within BOS   Standing balance support: Single extremity supported Standing balance-Leahy Scale: Poor Standing balance comment: pt requiring at least single UE support for standing balance                           ADL either performed or assessed with clinical judgement   ADL Overall ADL's : Needs assistance/impaired                                       General ADL Comments: Min A for LB ADL, CGA for functional ADL transfers, set up for seated grooming     Vision Patient Visual Report: No change from baseline       Perception     Praxis      Pertinent Vitals/Pain Pain Assessment: No/denies pain     Hand Dominance Right   Extremity/Trunk Assessment Upper Extremity Assessment Upper Extremity Assessment: Generalized weakness   Lower Extremity Assessment Lower Extremity Assessment: Generalized weakness       Communication Communication Communication: HOH(Has hearing aides)   Cognition Arousal/Alertness: Awake/alert Behavior During Therapy: [facility]  for tasks assessed/performed                                   General Comments: Oriented to at least person; pt's daughter reports pt was confused when pt was describing PLOF   General Comments       Exercises Other Exercises Other Exercises: Pt/dtr instructed in energy conservation strategies including pursed lip breathing and use of shower chair to take partially seated shower to decreased  energy use and minimize over exertion   Shoulder Instructions      Home Living Family/patient expects to be discharged to:: Private residence Living Arrangements: Alone Available Help at Discharge: Family;Home health Type of Home: House Home Access: Stairs to enter Technical brewer of Steps: 3 Entrance Stairs-Rails: Right;Left;Can reach both Home Layout: One level     Bathroom Shower/Tub: Occupational psychologist: Handicapped height(Also BSC over toilet in a different bathroom)     Home Equipment: Environmental consultant - 4 wheels;Bedside commode;Grab bars - toilet;Shower seat          Prior Functioning/Environment Level of Independence: Needs assistance  Gait / Transfers Assistance Needed: Ambulates shorter distances with 4ww (around 30 feet at most per pt's daughters description) ADL's / Homemaking Assistance Needed: Assist for bathing and meals. stands for shower. Aide assists with medication mgt   Comments: Has "sitter" than assists pt 5 days per week (M-F)  around 8:30 AM to around 2:30/3PM (6 hours per day).  2 falls in past 6 months.  Sleeps in recliner usually.        OT Problem List: Decreased strength;Decreased activity tolerance;Cardiopulmonary status limiting activity      OT Treatment/Interventions: Self-care/ADL training;Therapeutic exercise;Therapeutic activities;Energy conservation;DME and/or AE instruction;Patient/family education;Balance training    OT Goals(Current goals can be found in the care plan section) Acute Rehab OT Goals Patient Stated Goal: to improve breathing OT Goal Formulation: With patient/family Time For Goal Achievement: 12/20/19 Potential to Achieve Goals: Good ADL Goals Pt Will Perform Upper Body Dressing: with supervision;with set-up Pt Will Perform Lower Body Dressing: with min guard assist;sit to/from stand Pt Will Transfer to Toilet: with supervision;ambulating;bedside commode(LRAD for amb) Additional ADL Goal #1: Pt/family will  verbalize plan to implement at least 1 learned ECS  OT Frequency: Min 1X/week   Barriers to D/C:            Co-evaluation              AM-PAC OT "6 Clicks" Daily Activity     Outcome Measure Help from another person eating meals?: None Help from another person taking care of personal grooming?: None Help from another person toileting, which includes using toliet, bedpan, or urinal?: A Little Help from another person bathing (including washing, rinsing, drying)?: A Little Help from another person to put on and taking off regular upper body clothing?: None Help from another person to put on and taking off regular lower body clothing?: A Little 6 Click Score: 21   End of Session    Activity Tolerance: Patient tolerated treatment well Patient left: in chair;with call bell/phone within reach;with chair alarm set;with family/visitor present;with nursing/sitter in room  OT Visit Diagnosis: Other abnormalities of gait and mobility (R26.89);Muscle weakness (generalized) (M62.81)                Time: [id number] OT Time Calculation (min): 24 min Charges:  OT General Charges $OT Visit: 1 Visit OT Evaluation $  OT Eval Low Complexity: 1 Low OT Treatments $Self Care/Home Management : 8-22 mins  Jeni Salles, MPH, MS, OTR/L ascom [phone] 12/06/19, 4:50 PM

## 2019-12-06 NOTE — Evaluation (Signed)
Physical Therapy Evaluation Patient Details Name: Rhonda Mcclure MRN: [id number] DOB: 1923-04-16 Today's Date: 12/06/2019   History of Present Illness  Pt is a 85 y.o. female presenting to hospital 12/03/19 with diarrhea, weakness, and rectal bleeding.  Pt admitted with acute GI bleed in setting of acute diarrhea d/t acute colitis.  PMH includes CHF, htn, heart valve disorder.  Clinical Impression  Prior to hospital admission, pt was ambulatory with 4ww short household distances and had "sitter" M-F for 6 hours per day; lives alone but also has family support.  Pt's O2 sats 85% resting in bed (nurse placed pt on 2 L O2 via nasal cannula and O2 sats increased to 91-92%).  Currently pt is modified independent semi-supine to sitting edge of bed; CGA with transfers; and CGA walking a few feet with walker bed to recliner.  Overall pt steady with functional mobility using walker but pt appearing SOB and wheezing noises noted with and post activity (nurse notified who reported they would call respiratory for PRN breathing treatment); deferred further mobility d/t SOB/wheezing with minimal activity.  Anticipate pt will require increased assist (24/7 assist) upon hospital discharge (discussed with pt and pt's daughter).  Pt would benefit from skilled PT to address noted impairments and functional limitations (see below for any additional details).  Upon hospital discharge, pt would benefit from 24/7 assist and HHPT.    Follow Up Recommendations Home health PT;Supervision/Assistance - 24 hour    Equipment Recommendations  (pt has 4ww and BSC at home already)    Recommendations for Other Services OT consult     Precautions / Restrictions Precautions Precautions: Fall Restrictions Weight Bearing Restrictions: No      Mobility  Bed Mobility Overal bed mobility: Modified Independent             General bed mobility comments: Semi-supine to sitting edge of bed with use of bed rails with  increased time/effort.  Transfers Overall transfer level: Needs assistance Equipment used: Rolling walker (2 wheeled) Transfers: Sit to/from Stand Sit to Stand: Min guard         General transfer comment: mild increased effort to stand from bed but overall steady and safe  Ambulation/Gait Ambulation/Gait assistance: Min guard Gait Distance (Feet): 3 Feet(bed to recliner) Assistive device: Rolling walker (2 wheeled)   Gait velocity: decreased   General Gait Details: decreased B LE step length/foot clearance/heelstrike; steady with walker use; increased time to take steps to recliner  Stairs            Wheelchair Mobility    Modified Rankin (Stroke Patients Only)       Balance Overall balance assessment: Needs assistance Sitting-balance support: No upper extremity supported;Feet supported Sitting balance-Leahy Scale: Good Sitting balance - Comments: steady sitting reaching within BOS   Standing balance support: Single extremity supported Standing balance-Leahy Scale: Poor Standing balance comment: pt requiring at least single UE support for standing balance                             Pertinent Vitals/Pain Pain Assessment: No/denies pain  HR 90-110 bpm during session's activities.    Home Living Family/patient expects to be discharged to:: Private residence Living Arrangements: Alone Available Help at Discharge: Family;Home health Type of Home: House Home Access: Stairs to enter Entrance Stairs-Rails: Right;Left;Can reach both Entrance Stairs-Number of Steps: 3 Home Layout: One level Home Equipment: [place] - 4 wheels;Bedside commode;Grab bars - toilet  Prior Function Level of Independence: Needs assistance   Gait / Transfers Assistance Needed: Ambulates shorter distances with 4ww (around 30 feet at most per pt's daughters description)  ADL's / Homemaking Assistance Needed: Assist for bathing and meals.  Comments: Has "sitter" than  assists pt 5 days per week (M-F)  around 8:30 AM to around 2:30/3PM (6 hours per day).  2 falls in past 6 months.  Sleeps in recliner usually.     Hand Dominance        Extremity/Trunk Assessment   Upper Extremity Assessment Upper Extremity Assessment: Generalized weakness    Lower Extremity Assessment Lower Extremity Assessment: Generalized weakness       Communication   Communication: HOH(Has hearing aides)  Cognition Arousal/Alertness: Awake/alert Behavior During Therapy: WFL for tasks assessed/performed                                   General Comments: Oriented to at least person; pt's daughter reports pt was confused when pt was describing PLOF      General Comments   Nursing cleared pt for participation in physical therapy.  Pt agreeable to PT session.  Pt's daughter present during session.    Exercises     Assessment/Plan    PT Assessment Patient needs continued PT services  PT Problem List Decreased strength;Decreased activity tolerance;Decreased balance;Decreased mobility;Cardiopulmonary status limiting activity       PT Treatment Interventions DME instruction;Gait training;Stair training;Functional mobility training;Therapeutic activities;Therapeutic exercise;Balance training;Patient/family education    PT Goals (Current goals can be found in the Care Plan section)  Acute Rehab PT Goals Patient Stated Goal: to improve breathing PT Goal Formulation: With patient/family Time For Goal Achievement: 12/20/19 Potential to Achieve Goals: Fair    Frequency Min 2X/week   Barriers to discharge        Co-evaluation               AM-PAC PT "6 Clicks" Mobility  Outcome Measure Help needed turning from your back to your side while in a flat bed without using bedrails?: None Help needed moving from lying on your back to sitting on the side of a flat bed without using bedrails?: None Help needed moving to and from a bed to a chair  (including a wheelchair)?: A Little Help needed standing up from a chair using your arms (e.g., wheelchair or bedside chair)?: A Little Help needed to walk in hospital room?: A Little Help needed climbing 3-5 steps with a railing? : A Little 6 Click Score: 20    End of Session Equipment Utilized During Treatment: Gait belt Activity Tolerance: Other (comment)(Limited by SOB/wheezing with activity) Patient left: in chair;with call bell/phone within reach;with chair alarm set;with family/visitor present Nurse Communication: Mobility status;Precautions;Other (comment)(Pt's SOB and wheezing; also O2 sats during session) PT Visit Diagnosis: Other abnormalities of gait and mobility (R26.89);Muscle weakness (generalized) (M62.81);Difficulty in walking, not elsewhere classified (R26.2)    Time: [id number] PT Time Calculation (min) (ACUTE ONLY): 22 min   Charges:   PT Evaluation $PT Eval Low Complexity: 1 Low          Ahyan Kreeger, PT 12/06/19, 3:38 PM

## 2019-12-06 NOTE — TOC Initial Note (Signed)
Transition of Care [facility]) - Initial/Assessment Note    Patient Details  Name: Rhonda Mcclure MRN: [id number] Date of Birth: 1923-07-14  Transition of Care [facility]) CM/SW Contact:    Ross Ludwig, LCSW Phone Number: 12/06/2019, 6:06 PM  Clinical Narrative:                  Patient is a 85 year old female who is alert and oriented x3.  Patient lives alone but has caregivers for 6 hours a day.  Patient plans to return back home.  CSW spoke to patient's daughter to discuss home health agencies.  CSW informed patient's daughter that [facility] is in network with patient's insurance company, patient's daughter is agreeable to having home health agency.  CSW informed her that once patient is ready for discharge, [facility] will call her and discuss home health.  Patient's daughter was appreciative of information given.  Patient was also set up with new oxygen, which was delivered by [employer] today.  Patient's daughter did not express any other questions and did not express needing any other equipment.  Expected Discharge Plan: [place] Barriers to Discharge: Continued Medical Work up   Patient Goals and CMS Choice Patient states their goals for this hospitalization and ongoing recovery are:: To return back home with home health. CMS Medicare.gov Compare Post Acute Care list provided to:: Patient Represenative (must comment) Choice offered to / list presented to : Adult Children  Expected Discharge Plan and Services Expected Discharge Plan: [place] In-house Referral: Clinical Social Work   Post Acute Care Choice: [place] arrangements for the past 2 months: Single Family Home                 DME Arranged: Oxygen DME Agency: [employer] Date DME Agency Contacted: 12/06/19 Time DME Agency Contacted: 1500 Representative spoke with at DME Agency: [place]: RN, PT, OT, Nurse's Aide, Social Work [place] Date  [place]: 12/06/19 Time [place]: 1805 Representative spoke with at [place] Arrangements/Services Living arrangements for the past 2 months: [place] with:: Self Patient language and need for interpreter reviewed:: Yes Do you feel safe going back to the place where you live?: Yes      Need for Family Participation in Patient Care: Yes (Comment) Care giver support system in place?: Yes (comment) Current home services: Other (comment)(Personal Caregivers) Criminal Activity/Legal Involvement Pertinent to Current Situation/Hospitalization: No - Comment as needed  Activities of Daily Living Home Assistive Devices/Equipment: Walker (specify type), Grab bars around toilet, Hearing aid, Raised toilet seat with rails ADL Screening (condition at time of admission) Patient's cognitive ability adequate to safely complete daily activities?: Yes Is the patient deaf or have difficulty hearing?: Yes Does the patient have difficulty seeing, even when wearing glasses/contacts?: Yes Does the patient have difficulty concentrating, remembering, or making decisions?: Yes Patient able to express need for assistance with ADLs?: Yes Does the patient have difficulty dressing or bathing?: Yes Independently performs ADLs?: No Communication: Needs assistance Is this a change from baseline?: Pre-admission baseline Dressing (OT): Needs assistance Is this a change from baseline?: Pre-admission baseline Grooming: Needs assistance Is this a change from baseline?: Pre-admission baseline Feeding: Independent Bathing: Needs assistance Is this a change from baseline?: Pre-admission baseline Toileting: Needs assistance Is this a change from baseline?: Pre-admission baseline In/Out Bed: Needs assistance Is this a change from baseline?: Pre-admission baseline  Walks in Home: Needs assistance Is this a change from baseline?: Pre-admission baseline Does the  patient have difficulty walking or climbing stairs?: Yes Weakness of Legs: Both Weakness of Arms/Hands: None  Permission Sought/Granted Permission sought to share information with : Case Manager, Family Supports Permission granted to share information with : Yes, Release of Information Signed  Share Information with NAME: Tally Joe Daughter [id number] [phone]  Permission granted to share info w AGENCY: [place]        Emotional Assessment Appearance:: Appears stated age   Affect (typically observed): Calm, Accepting, Appropriate Orientation: : Oriented to Self, Oriented to Place, Oriented to Situation Alcohol / Substance Use: Not Applicable Psych Involvement: No (comment)  Admission diagnosis:  Acute blood loss anemia [D62] Colitis [K52.9] Rectal bleeding [K62.5] Diarrhea, unspecified type [R19.7] Patient Active Problem List   Diagnosis Date Noted  . Colitis 12/03/2019  . Essential hypertension 12/03/2019  . Atrial fibrillation, chronic ([place]) 12/03/2019  . Diarrhea 12/03/2019  . Hyperlipidemia 12/03/2019  . GI bleed 12/03/2019   PCP:  Inc, [place]:   [facility] [id number], [place] AT [address] [place] [address] Phone: [phone] Fax: [phone]  [place], [place] - [address] [address] Phone: [phone] Fax: [phone]     Social Determinants of Health (SDOH) Interventions    Readmission Risk Interventions No flowsheet data found.

## 2019-12-06 NOTE — Progress Notes (Signed)
Cephas Darby, MD [address]  [place]  [place], [place] [address]  Main: [phone]  Fax: [phone] Pager: [phone]   Subjective: No acute events overnight.  She had one bowel movement today which was brown.  She denies abdominal pain, nausea or vomiting.  She is tolerating diet well.  Her hemoglobin is stable.  Currently on nasal cannula to maintain oxygen saturation   Objective: Vital signs in last 24 hours: Vitals:   12/06/19 0440 12/06/19 0828 12/06/19 1643 12/06/19 1829  BP: 139/76 133/75  129/65  Pulse: 99 88 87 82  Resp: 20 18  18   Temp: 98 F (36.7 C) 97.6 F (36.4 C)  97.8 F (36.6 C)  TempSrc: Oral Oral  Oral  SpO2: 98% 99% 97% 99%  Weight:      Height:       Weight change:   Intake/Output Summary (Last 24 hours) at 12/06/2019 1922 Last data filed at 12/06/2019 1831 Gross per 24 hour  Intake 110.14 ml  Output 800 ml  Net -689.86 ml     Exam: Heart:: Regular rate and rhythm, S1S2 present or without murmur or extra heart sounds Lungs: normal and clear to auscultation Abdomen: soft, nontender, normal bowel sounds   Lab Results: CBC Latest Ref Rng & Units 12/06/2019 12/05/2019 12/04/2019  WBC 4.0 - 10.5 K/uL 7.0 5.9 9.0  Hemoglobin 12.0 - 15.0 g/dL 9.2(L) 9.9(L) 9.5(L)  Hematocrit 36.0 - 46.0 % 28.2(L) 30.2(L) 28.9(L)  Platelets 150 - 400 K/uL 125(L) 157 166   CMP Latest Ref Rng & Units 12/06/2019 12/05/2019 12/04/2019  Glucose 70 - 99 mg/dL 103(H) 111(H) 87  BUN 8 - 23 mg/dL 13 11 19   Creatinine 0.44 - 1.00 mg/dL 0.69 0.57 0.71  Sodium 135 - 145 mmol/L 138 140 136  Potassium 3.5 - 5.1 mmol/L 3.6 3.7 3.8  Chloride 98 - 111 mmol/L 103 109 102  CO2 22 - 32 mmol/L 25 23 25   Calcium 8.9 - 10.3 mg/dL 9.0 9.2 8.8(L)  Total Protein 6.5 - 8.1 g/dL - - 6.2(L)  Total Bilirubin 0.3 - 1.2 mg/dL - - 1.1  Alkaline Phos 38 - 126 U/L - - 52  AST 15 - 41 U/L - - 18  ALT 0 - 44 U/L - - 8    Micro Results: Recent Results (from the past  240 hour(s))  SARS CORONAVIRUS 2 (TAT 6-24 HRS) Nasopharyngeal Nasopharyngeal Swab     Status: None   Collection Time: 12/03/19  7:35 PM   Specimen: Nasopharyngeal Swab  Result Value Ref Range Status   SARS Coronavirus 2 NEGATIVE NEGATIVE Final    Comment: (NOTE) SARS-CoV-2 target nucleic acids are NOT DETECTED. The SARS-CoV-2 RNA is generally detectable in upper and lower respiratory specimens during the acute phase of infection. Negative results do not preclude SARS-CoV-2 infection, do not rule out co-infections with other pathogens, and should not be used as the sole basis for treatment or other patient management decisions. Negative results must be combined with clinical observations, patient history, and epidemiological information. The expected result is Negative. Fact Sheet for Patients: SugarRoll.be Fact Sheet for Healthcare Providers: https://www.woods-mathews.com/ This test is not yet approved or cleared by the [place] FDA and  has been authorized for detection and/or diagnosis of SARS-CoV-2 by FDA under an Emergency Use Authorization (EUA). This EUA will remain  in effect (meaning this test can be used) for the duration of the COVID-19 declaration under Section 56 4(b)(1) of the Act, 21  U.S.C. section 360bbb-3(b)(1), unless the authorization is terminated or revoked sooner. Performed at [facility], [place] [address], [place], [place] [address]    Studies/Results: DG Chest Port 1 View  Result Date: 12/05/2019 CLINICAL DATA:  Short of breath EXAM: PORTABLE CHEST 1 VIEW COMPARISON:  2011 FINDINGS: Increased interstitial prominence and patchy opacities. No pleural effusion. Top normal heart size. No pneumothorax. Calcified plaque along the thoracic aorta. IMPRESSION: Interstitial prominence and patchy opacities favored to reflect edema. Electronically Signed   By: Macy Mis M.D.   On: 12/05/2019 08:02   Medications:    I have reviewed the patient's current medications. Prior to Admission:  Medications Prior to Admission  Medication Sig Dispense Refill Last Dose  . amLODipine (NORVASC) 5 MG tablet Take 5 mg by mouth daily.   12/03/2019 at 0900  . apixaban (ELIQUIS) 2.5 MG TABS tablet Take 2.5 mg by mouth daily.   12/03/2019 at 0900  . cholecalciferol (VITAMIN D3) 25 MCG (1000 UT) tablet Take 1,000 Units by mouth daily.   12/03/2019 at 0900  . enalapril (VASOTEC) 20 MG tablet Take 20 mg by mouth daily.   12/03/2019 at 0900  . FLOVENT HFA 220 MCG/ACT inhaler Inhale 2 puffs into the lungs 2 (two) times daily.   12/03/2019 at 0900  . furosemide (LASIX) 20 MG tablet Take 20 mg by mouth daily.   12/03/2019 at 0900  . Ipratropium-Albuterol (COMBIVENT RESPIMAT) 20-100 MCG/ACT AERS respimat 1 puff daily as needed.    prn at prn  . metoprolol tartrate (LOPRESSOR) 25 MG tablet Take 25 mg by mouth 2 (two) times daily.   12/03/2019 at 0900  . potassium chloride (K-DUR,KLOR-CON) 10 MEQ tablet Take 10 mEq by mouth daily.    12/03/2019 at 0900  . simvastatin (ZOCOR) 20 MG tablet Take 20 mg by mouth daily.   12/03/2019 at 0900   Scheduled: . amLODipine  5 mg Oral Daily  . budesonide  1 mg Nebulization BID  . cholecalciferol  1,000 Units Oral Daily  . enalapril  20 mg Oral Daily  . furosemide  20 mg Oral Daily  . metoprolol tartrate  50 mg Oral BID  . [START ON 12/07/2019] pantoprazole  40 mg Intravenous Q12H  . simvastatin  20 mg Oral Daily  . sodium chloride flush  3 mL Intravenous Q12H   Continuous: . sodium chloride Stopped (12/06/19 0519)  . cefTRIAXone (ROCEPHIN)  IV Stopped (12/05/19 2056)  . metronidazole 500 mg (12/06/19 1902)   [id number] chloride, acetaminophen **OR** acetaminophen, ipratropium-albuterol, metoprolol tartrate, ondansetron (ZOFRAN) IV, sodium chloride flush Anti-infectives (From admission, onward)   Start     Dose/Rate Route Frequency Ordered Stop   12/04/19 1930  cefTRIAXone (ROCEPHIN) 2  g in sodium chloride 0.9 % 100 mL IVPB     2 g 200 mL/hr over 30 Minutes Intravenous Every 24 hours 12/03/19 2135     12/04/19 0300  metroNIDAZOLE (FLAGYL) IVPB 500 mg     500 mg 100 mL/hr over 60 Minutes Intravenous Every 8 hours 12/03/19 2135     12/03/19 1900  cefTRIAXone (ROCEPHIN) 2 g in sodium chloride 0.9 % 100 mL IVPB     2 g 200 mL/hr over 30 Minutes Intravenous  Once 12/03/19 1828 12/03/19 2004   12/03/19 1900  metroNIDAZOLE (FLAGYL) IVPB 500 mg     500 mg 100 mL/hr over 60 Minutes Intravenous  Once 12/03/19 1828 12/03/19 2030     Scheduled Meds: . amLODipine  5 mg Oral Daily  .  budesonide  1 mg Nebulization BID  . cholecalciferol  1,000 Units Oral Daily  . enalapril  20 mg Oral Daily  . furosemide  20 mg Oral Daily  . metoprolol tartrate  50 mg Oral BID  . [START ON 12/07/2019] pantoprazole  40 mg Intravenous Q12H  . simvastatin  20 mg Oral Daily  . sodium chloride flush  3 mL Intravenous Q12H   Continuous Infusions: . sodium chloride Stopped (12/06/19 0519)  . cefTRIAXone (ROCEPHIN)  IV Stopped (12/05/19 2056)  . metronidazole 500 mg (12/06/19 1902)   PRN Meds:.sodium chloride, acetaminophen **OR** acetaminophen, ipratropium-albuterol, metoprolol tartrate, ondansetron (ZOFRAN) IV, sodium chloride flush   Assessment: Principal Problem:   Colitis Active Problems:   Essential hypertension   Atrial fibrillation, chronic (HCC)   Diarrhea   Hyperlipidemia   GI bleed  Left-sided colitis, infectious versus ischemic colitis.  Less likely malignancy Overall, clinically improving, hemoglobin is stable.  No recurrent episodes of active hematochezia.  Currently off Eliquis  Plan: Recommend to continue Cipro and Flagyl for 1 week only Diet as tolerated Appreciate cardiology input who agreed to hold Eliquis for 2 weeks at least and determine the need to restart as outpatient Patient can follow-up with me as outpatient as needed  GI will sign off at this time, please  call us back with questions or concerns    LOS: 3 days   Inell Mimbs 12/06/2019, 7:22 PM

## 2019-12-06 NOTE — Progress Notes (Signed)
SATURATION QUALIFICATIONS: (This note is used to comply with regulatory documentation for home oxygen)  Patient Saturations on Room Air at Rest = 85%  Patient Saturations on Room Air while Ambulating =   Patient Saturations on  Liters of oxygen while Ambulating =  Please briefly explain why patient needs home oxygen  Pt unable to maintain O2 sat above 90% on room air  :

## 2019-12-07 LAB — CBC WITH DIFFERENTIAL/PLATELET
Abs Immature Granulocytes: 0.42 10*3/uL — ABNORMAL HIGH (ref 0.00–0.07)
Basophils Absolute: 0 10*3/uL (ref 0.0–0.1)
Basophils Relative: 1 %
Eosinophils Absolute: 0 10*3/uL (ref 0.0–0.5)
Eosinophils Relative: 1 %
HCT: 28.6 % — ABNORMAL LOW (ref 36.0–46.0)
Hemoglobin: 9.5 g/dL — ABNORMAL LOW (ref 12.0–15.0)
Immature Granulocytes: 7 %
Lymphocytes Relative: 15 %
Lymphs Abs: 1 10*3/uL (ref 0.7–4.0)
MCH: 34.7 pg — ABNORMAL HIGH (ref 26.0–34.0)
MCHC: 33.2 g/dL (ref 30.0–36.0)
MCV: 104.4 fL — ABNORMAL HIGH (ref 80.0–100.0)
Monocytes Absolute: 1.8 10*3/uL — ABNORMAL HIGH (ref 0.1–1.0)
Monocytes Relative: 28 %
Neutro Abs: 3.2 10*3/uL (ref 1.7–7.7)
Neutrophils Relative %: 48 %
Platelets: 142 10*3/uL — ABNORMAL LOW (ref 150–400)
RBC: 2.74 MIL/uL — ABNORMAL LOW (ref 3.87–5.11)
RDW: 19.9 % — ABNORMAL HIGH (ref 11.5–15.5)
Smear Review: NORMAL
WBC: 6.5 10*3/uL (ref 4.0–10.5)
nRBC: 1.1 % — ABNORMAL HIGH (ref 0.0–0.2)

## 2019-12-07 LAB — BASIC METABOLIC PANEL
Anion gap: 8 (ref 5–15)
BUN: 10 mg/dL (ref 8–23)
CO2: 28 mmol/L (ref 22–32)
Calcium: 8.8 mg/dL — ABNORMAL LOW (ref 8.9–10.3)
Chloride: 103 mmol/L (ref 98–111)
Creatinine, Ser: 0.61 mg/dL (ref 0.44–1.00)
GFR calc Af Amer: >60 mL/min (ref >60)
GFR calc non Af Amer: >60 mL/min (ref >60)
Glucose, Bld: 104 mg/dL — ABNORMAL HIGH (ref 70–99)
Potassium: 3.3 mmol/L — ABNORMAL LOW (ref 3.5–5.1)
Sodium: 139 mmol/L (ref 135–145)

## 2019-12-07 MED ORDER — CIPROFLOXACIN HCL 500 MG PO TABS: 500.0000 mg | ORAL_TABLET | Freq: Two times a day (BID) | ORAL | 0 refills | Status: AC

## 2019-12-07 MED ORDER — METRONIDAZOLE 500 MG PO TABS: 500.0000 mg | ORAL_TABLET | Freq: Three times a day (TID) | ORAL | 0 refills | Status: AC

## 2019-12-07 MED ORDER — BENZONATATE 100 MG PO CAPS: 200.0000 mg | ORAL_CAPSULE | Freq: Three times a day (TID) | ORAL | Status: DC | PRN

## 2019-12-07 MED ORDER — METOPROLOL TARTRATE 50 MG PO TABS: 50.0000 mg | ORAL_TABLET | Freq: Every day | ORAL | Status: DC

## 2019-12-07 MED ORDER — BENZONATATE 100 MG PO CAPS: 200.0000 mg | ORAL_CAPSULE | Freq: Three times a day (TID) | ORAL | 0 refills | Status: AC | PRN

## 2019-12-07 MED ORDER — METOPROLOL TARTRATE 50 MG PO TABS
50.0000 mg | ORAL_TABLET | Freq: Once | ORAL | Status: AC
  Administered 2019-12-07: 12:00:00 50 mg via ORAL
  Filled 2019-12-07: qty 1

## 2019-12-07 MED ORDER — POTASSIUM CHLORIDE CRYS ER 20 MEQ PO TBCR
40.0000 meq | EXTENDED_RELEASE_TABLET | Freq: Once | ORAL | Status: AC
  Administered 2019-12-07: 40 meq via ORAL
  Filled 2019-12-07: qty 2

## 2019-12-07 MED ORDER — ACETAMINOPHEN 325 MG PO TABS: 650.0000 mg | ORAL_TABLET | Freq: Four times a day (QID) | ORAL | 0 refills | Status: AC | PRN

## 2019-12-07 MED ORDER — METOPROLOL TARTRATE 50 MG PO TABS: 50.0000 mg | ORAL_TABLET | Freq: Every morning | ORAL | Status: DC

## 2019-12-07 MED ORDER — METOPROLOL TARTRATE 100 MG PO TABS: 100.0000 mg | ORAL_TABLET | Freq: Two times a day (BID) | ORAL | 0 refills | Status: DC

## 2019-12-07 MED ORDER — METOPROLOL TARTRATE 50 MG PO TABS: 100.0000 mg | ORAL_TABLET | Freq: Every day | ORAL | Status: DC

## 2019-12-07 MED ORDER — PANTOPRAZOLE SODIUM 40 MG PO TBEC: 40.0000 mg | DELAYED_RELEASE_TABLET | Freq: Every day | ORAL | 0 refills | Status: DC

## 2019-12-07 MED ORDER — BENZONATATE 200 MG PO CAPS: 200.0000 mg | ORAL_CAPSULE | Freq: Three times a day (TID) | ORAL | 0 refills | Status: AC | PRN

## 2019-12-07 NOTE — Discharge Instructions (Signed)

## 2019-12-07 NOTE — Progress Notes (Signed)
Patient heart rate jumping up to 140's at times.  Noted when she was coughing or ambulating with PT.  Dr. Jimmye Norman aware.  New orders received and cardiology to see patient prior to discharge.  Patient heart rate does decrease with rest, between 90's-low 100's.

## 2019-12-07 NOTE — Progress Notes (Signed)
[facility] Encounter Note  Patient: Rhonda Mcclure / Admit Date: 12/03/2019 / Date of Encounter: 12/07/2019, 1:17 PM   Subjective: Patient feeling relatively well today.  No evidence of significant new cardiac symptoms.  Patient does have chronic nonvalvular atrial fibrillation but slightly more rapid rate with ambulation.  Better control this afternoon with increased doses of metoprolol.  Further discussion about anticoagulation with GI bleed with abstinence at this time  Review of Systems: Positive for: None Negative for: Vision change, hearing change, syncope, dizziness, nausea, vomiting,diarrhea, bloody stool, stomach pain, cough, congestion, diaphoresis, urinary frequency, urinary pain,skin lesions, skin rashes Others previously listed  Objective: Telemetry: Atrial fibrillation Physical Exam: Blood pressure (!) 137/98, pulse (!) 105, temperature (!) 97.5 F (36.4 C), temperature source Oral, resp. rate 19, height 5\' 1"  (1.549 m), weight 48.9 kg, SpO2 91 %. Body mass index is 20.35 kg/m. General: Well developed, well nourished, in no acute distress. Head: Normocephalic, atraumatic, sclera non-icteric, no xanthomas, nares are without discharge. Neck: No apparent masses Lungs: Normal respirations with no wheezes, no rhonchi, no rales , few crackles   Heart: Irregular rate and rhythm, normal S1 S2, no murmur, no rub, no gallop, PMI is normal size and placement, carotid upstroke normal without bruit, jugular venous pressure normal Abdomen: Soft, non-tender, non-distended with normoactive bowel sounds. No hepatosplenomegaly. Abdominal aorta is normal size without bruit Extremities: Trace edema, no clubbing, no cyanosis, no ulcers,  Peripheral: 2+ radial, 2+ femoral, 2+ dorsal pedal pulses Neuro: Alert and oriented. Moves all extremities spontaneously. Psych:  Responds to questions appropriately with a normal affect.   Intake/Output Summary (Last 24 hours) at  12/07/2019 1317 Last data filed at 12/07/2019 0930 Gross per 24 hour  Intake 360 ml  Output 250 ml  Net 110 ml    Inpatient Medications:  . amLODipine  5 mg Oral Daily  . budesonide  1 mg Nebulization BID  . cholecalciferol  1,000 Units Oral Daily  . enalapril  20 mg Oral Daily  . furosemide  20 mg Oral Daily  . metoprolol tartrate  100 mg Oral Daily   And  . metoprolol tartrate  50 mg Oral QHS  . pantoprazole  40 mg Intravenous Q12H  . simvastatin  20 mg Oral Daily  . sodium chloride flush  3 mL Intravenous Q12H   Infusions:  . sodium chloride Stopped (12/06/19 0519)  . cefTRIAXone (ROCEPHIN)  IV 2 g (12/06/19 2037)  . metronidazole 500 mg (12/07/19 0929)    Labs: Recent Labs    12/05/19 0643 12/06/19 0815 12/07/19 0603  NA 140 138 139  K 3.7 3.6 3.3*  CL 109 103 103  CO2 23 25 28   GLUCOSE 111* 103* 104*  BUN 11 13 10   CREATININE 0.57 0.69 0.61  CALCIUM 9.2 9.0 8.8*  MG 2.2 1.7  --    No results for input(s): AST, ALT, ALKPHOS, BILITOT, PROT, ALBUMIN in the last 72 hours. Recent Labs    12/06/19 0815 12/07/19 0603  WBC 7.0 6.5  NEUTROABS 3.6 3.2  HGB 9.2* 9.5*  HCT 28.2* 28.6*  MCV 104.4* 104.4*  PLT 125* 142*   No results for input(s): CKTOTAL, CKMB, TROPONINI in the last 72 hours. Invalid input(s): POCBNP No results for input(s): HGBA1C in the last 72 hours.   Weights: Filed Weights   12/03/19 1221 12/04/19 0126 12/07/19 0316  Weight: 59 kg 49.9 kg 48.9 kg     Radiology/Studies:  CT ABDOMEN PELVIS W CONTRAST  Result  Date: 12/03/2019 CLINICAL DATA:  Diarrhea for 3 days. Bright red blood per rectum today. EXAM: CT ABDOMEN AND PELVIS WITH CONTRAST TECHNIQUE: Multidetector CT imaging of the abdomen and pelvis was performed using the standard protocol following bolus administration of intravenous contrast. CONTRAST:  100 mL OMNIPAQUE IOHEXOL 300 MG/ML  SOLN COMPARISON:  CT abdomen and pelvis 01/05/2015. FINDINGS: Lower chest: Lung bases are  emphysematous with basilar atelectasis. There is cardiomegaly. No pleural or pericardial effusion. Hepatobiliary: A few small stones are seen layering dependently in the gallbladder. No evidence of cholecystitis. No focal liver lesion. Biliary tree unremarkable. Pancreas: Unremarkable. No pancreatic ductal dilatation or surrounding inflammatory changes. Spleen: Normal in size without focal abnormality. Adrenals/Urinary Tract: Adrenal glands are unremarkable. Kidneys are normal, without renal calculi, solid lesion, or hydronephrosis. Bladder is unremarkable. Small renal cysts noted. Stomach/Bowel: There is thickening of the walls of the mid to distal transverse and proximal descending colon consistent with infectious or inflammatory colitis. No pneumatosis, portal venous gas or free air. No bowel obstruction. The stomach and small bowel are unremarkable. No evidence of appendicitis. Vascular/Lymphatic: Aortic atherosclerosis. No enlarged abdominal or pelvic lymph nodes. Reproductive: Status post hysterectomy. No adnexal masses. Other: Small volume of free pelvic fluid noted. Musculoskeletal: Biconcave compression fracture of L2 is new since the prior examination but appears remote. No acute or focal abnormality is identified. IMPRESSION: Findings most consistent with infectious or inflammatory colitis of the mid to distal transverse and proximal descending colon. No CT evidence of ischemia. Small volume of free pelvic fluid is likely secondary to colitis. Atherosclerosis. Electronically Signed   By: Inge Rise M.D.   On: 12/03/2019 17:00   DG Chest Port 1 View  Result Date: 12/05/2019 CLINICAL DATA:  Short of breath EXAM: PORTABLE CHEST 1 VIEW COMPARISON:  2011 FINDINGS: Increased interstitial prominence and patchy opacities. No pleural effusion. Top normal heart size. No pneumothorax. Calcified plaque along the thoracic aorta. IMPRESSION: Interstitial prominence and patchy opacities favored to reflect  edema. Electronically Signed   By: Macy Mis M.D.   On: 12/05/2019 08:02     Assessment and Recommendation  85 y.o. female with hypertension hyperlipidemia and recent GI bleed now stable after discontinuation of anticoagulation and atrial fibrillation with rapid ventricular rate 1.  Increase metoprolol dose for better heart rate control with ambulation at 100 mg twice per day 2.  Abstain from anticoagulation due to GI bleed and long discussion with family and will reassess as an outpatient in 1 to 2 weeks 3.  No further cardiac diagnostics necessary at this time 4.  Ambulate and follow for improvements of symptoms and okay for discharged home from cardiac standpoint today  Signed, Serafina Royals M.D. FACC

## 2019-12-07 NOTE — Discharge Summary (Signed)
Physician Discharge Summary  Rhonda Mcclure [id number] DOB: 04-30-1923 DOA: 12/03/2019  PCP: [facility] [place] date: 12/03/2019 Discharge date: 12/07/2019  Admitted From: Home Disposition:  home  Recommendations for Outpatient Follow-up:  1. Follow up with PCP in 1-2 weeks 2. F/u w/ cardio in 1-2 weeks 3. F/u GI in 2 weeks    Home Health: yes Equipment/Devices:  Discharge Condition: stable  CODE STATUS: full  Diet recommendation: Heart Healthy   Brief/Interim Summary: HPI was taken from Dr. Josephine Mcclure: Rhonda Mcclure is a 85 y.o. female with medical history significant for CHF, hypertension and hyperlipidemia who presents to the emergency department via EMS and accompanied by daughter due to complaints of diarrhea and weakness which started yesterday.  Patient was hard of hearing, history was provided by daughter at bedside.  Per daughter, patient complained of left lower quadrant abdominal pain (crampy in nature) accompanied with diarrhea yesterday, during the course of the day, abdominal pain improved.  This morning, she noted bright red blood in her stool which was witnessed by health care aide that notified the daughter.  Daughter states that she asked her daughter-in-law (a Marine scientist) and she recommended for patient to be taken to the ED for further evaluation and management.  Patient denies nausea, vomiting, fever, chills, chest pain or shortness of breath.  ED Course:  In the emergency department, she was tachycardic and tachypneic, but other vital signs were within normal range.  Hemoglobin was 10.3 on 1st lab at ED, but this decreased to 9.2 on subsequent CBC.  Lactic acid and urinalysis were normal.  CT abdomen and pelvis with contrast showed findings consistent with infectious or inflammatory colitis over the mid to distal transverse and proximal descending colon.  He was treated with IV ceftriaxone and Flagyl.  Regarding patient's GI bleed, ED physician states  that he spoke with gastroenterologist(Dr. Vicente Mcclure) who recommended monitoring hemoglobin overnight with plan to see patient in the morning.  Hospitalist was asked to admit him for further evaluation and management.   Hospital course from Dr. Lenise Mcclure: Pt was found to have a GI bleed of unknown etiology possibly secondary to eliquis vs colitis vs cancer. Pt was treated w/ IV protonix & w/ holding of eliquis as well as monitoring of H&H. Pt did not require a transfusion. Also, pt was found to have colitis on CT abd/pelvis. Pt was treated w/ IV abxs. Pt and pt's daughter did not want the pt to have any procedures done. Cardio was consulted to determine if long term anticoagulation should continue and cardio recommended staying off of eliquis until the pt was able to see him outpatient. Pt's daughter verbalized her understanding.  Of note, pt's new scripts were sent twice. The first pharmacy on file was closed until 12/21 so the new scripts were sent again to a different pharmacy that was still open on the day of d/c.  Discharge Diagnoses:  Principal Problem:   Colitis Active Problems:   Essential hypertension   Atrial fibrillation, chronic (HCC)   Diarrhea   Hyperlipidemia   GI bleed   Acute GI bleed: etiology unclear, eliquis use vs colitis. Continue to hold eliquis. CT abd/pelvis showed findings consistent with infectious or inflammatory colitis over the mid to distal transverse and proximal descending colon. Continue on IV ceftriaxone & flagyl. IV zofran prn for nausea/vomiting. Continue on protonix. GI following & recs apprec. Possible flexible sigmoidoscopy or colonoscopy on friday or over the weekend but pt does not want any  procedures.Continue to monitor H&H  Colitis: infectious vs ischemic. H&H are stable. Continue on IV ceftriaxone & flagyl. Pt did not want any procedures including flex sig or colonoscopy  Acute blood loss anemia & macrocytic anemia: acute blood loss likely secondary  to above. Folate & B12 are both WNL. H&H are stable. Will continue to monitor H&H    Chronic a. Fib: continue to hold eliquis secondary to GI bleed and cardio will decide as an outpatient whether or not to restart. Continue on increased dose of metoprolol. Continue on tele   Chronic diastolic CHF: as per cardio. Continue on home dose of lasix. Monitor I/Os.  Hypomagnesemia: WNL. Will continue to monitor   HTN: will continue on amlodipine, metoprolol, lasix, & enalapril.   HLD: will continue on statin   Vitamin D deficiency: will continue on vitamin D supplements   Generalized weakness: PT/OT recommended home health  Discharge Instructions  Discharge Instructions    Diet - low sodium heart healthy   Complete by: As directed    Discharge instructions   Complete by: As directed    F/u w/ PCP in 1-2 weeks; F/u w/ cardio in 1-2 weeks; F/u w/ GI in 2 weeks   Increase activity slowly   Complete by: As directed      Allergies as of 12/07/2019      Reactions   Sulfamethoxazole Swelling   Pt had thick tongue and felt disoriented Pt had thick tongue and felt disoriented   Morphine And Related    Penicillins Hives   Per patient, total body hives. No SOB noted. Young adulthood. Per daughter, who reported that at some point her mother was given a combination form of PCN and did okay with it. However, the daughter is unable to remember the name of the medication.       Medication List    STOP taking these medications   apixaban 2.5 MG Tabs tablet Commonly known as: ELIQUIS     TAKE these medications   acetaminophen 325 MG tablet Commonly known as: TYLENOL Take 2 tablets (650 mg total) by mouth every 6 (six) hours as needed for mild pain (or Fever >/= 101).   amLODipine 5 MG tablet Commonly known as: NORVASC Take 5 mg by mouth daily. Notes to patient: Tomorrow at 9A.    benzonatate 200 MG capsule Commonly known as: TESSALON Take 1 capsule (200 mg total) by mouth 3  (three) times daily as needed for up to 5 days for cough.   benzonatate 100 MG capsule Commonly known as: Tessalon Perles Take 2 capsules (200 mg total) by mouth 3 (three) times daily as needed for up to 5 days for cough.   cholecalciferol 25 MCG (1000 UT) tablet Commonly known as: VITAMIN D3 Take 1,000 Units by mouth daily. Notes to patient: Tomorrow at 9A.    ciprofloxacin 500 MG tablet Commonly known as: CIPRO Take 1 tablet (500 mg total) by mouth 2 (two) times daily for 4 days. Notes to patient: Today when home.    ciprofloxacin 500 MG tablet Commonly known as: CIPRO Take 1 tablet (500 mg total) by mouth 2 (two) times daily for 5 days.   Combivent Respimat 20-100 MCG/ACT Aers respimat Generic drug: Ipratropium-Albuterol 1 puff daily as needed.   enalapril 20 MG tablet Commonly known as: VASOTEC Take 20 mg by mouth daily. Notes to patient: Tomorrow at 9A.    Flovent HFA 220 MCG/ACT inhaler Generic drug: fluticasone Inhale 2 puffs into the lungs 2 (two)  times daily. Notes to patient: Today when home.    furosemide 20 MG tablet Commonly known as: LASIX Take 20 mg by mouth daily. Notes to patient: Tomorrow at 9A.    metoprolol tartrate 100 MG tablet Commonly known as: LOPRESSOR Take 1 tablet (100 mg total) by mouth 2 (two) times daily. What changed:   medication strength  how much to take Notes to patient: Tonight at bedtime.    metroNIDAZOLE 500 MG tablet Commonly known as: Flagyl Take 1 tablet (500 mg total) by mouth 3 (three) times daily for 4 days. Notes to patient: Today at 5:30P.    metroNIDAZOLE 500 MG tablet Commonly known as: Flagyl Take 1 tablet (500 mg total) by mouth 3 (three) times daily for 4 days.   pantoprazole 40 MG tablet Commonly known as: Protonix Take 1 tablet (40 mg total) by mouth daily. Notes to patient: Tomorrow at 9A.    pantoprazole 40 MG tablet Commonly known as: Protonix Take 1 tablet (40 mg total) by mouth daily.    potassium chloride 10 MEQ tablet Commonly known as: KLOR-CON Take 10 mEq by mouth daily. Notes to patient: Tomorrow at 9A.    simvastatin 20 MG tablet Commonly known as: ZOCOR Take 20 mg by mouth daily. Notes to patient: Tomorrow at 9A.       Follow-up Information    Corey Skains, MD Follow up in 2 week(s).   Specialty: Cardiology Contact information: [facility] [place] [address] [phone]        Lin Landsman, MD Follow up in 2 week(s).   Specialty: Gastroenterology Contact information: [place] [address] [phone]          Allergies  Allergen Reactions  . Sulfamethoxazole Swelling    Pt had thick tongue and felt disoriented Pt had thick tongue and felt disoriented   . Morphine And Related   . Penicillins Hives    Per patient, total body hives. No SOB noted. Young adulthood. Per daughter, who reported that at some point her mother was given a combination form of PCN and did okay with it. However, the daughter is unable to remember the name of the medication.     Consultations: Cardio, GI  Procedures/Studies: CT ABDOMEN PELVIS W CONTRAST  Result Date: 12/03/2019 CLINICAL DATA:  Diarrhea for 3 days. Bright red blood per rectum today. EXAM: CT ABDOMEN AND PELVIS WITH CONTRAST TECHNIQUE: Multidetector CT imaging of the abdomen and pelvis was performed using the standard protocol following bolus administration of intravenous contrast. CONTRAST:  100 mL OMNIPAQUE IOHEXOL 300 MG/ML  SOLN COMPARISON:  CT abdomen and pelvis 01/05/2015. FINDINGS: Lower chest: Lung bases are emphysematous with basilar atelectasis. There is cardiomegaly. No pleural or pericardial effusion. Hepatobiliary: A few small stones are seen layering dependently in the gallbladder. No evidence of cholecystitis. No focal liver lesion. Biliary tree unremarkable. Pancreas: Unremarkable. No pancreatic ductal dilatation  or surrounding inflammatory changes. Spleen: Normal in size without focal abnormality. Adrenals/Urinary Tract: Adrenal glands are unremarkable. Kidneys are normal, without renal calculi, solid lesion, or hydronephrosis. Bladder is unremarkable. Small renal cysts noted. Stomach/Bowel: There is thickening of the walls of the mid to distal transverse and proximal descending colon consistent with infectious or inflammatory colitis. No pneumatosis, portal venous gas or free air. No bowel obstruction. The stomach and small bowel are unremarkable. No evidence of appendicitis. Vascular/Lymphatic: Aortic atherosclerosis. No enlarged abdominal or pelvic lymph nodes. Reproductive: Status post hysterectomy. No adnexal  masses. Other: Small volume of free pelvic fluid noted. Musculoskeletal: Biconcave compression fracture of L2 is new since the prior examination but appears remote. No acute or focal abnormality is identified. IMPRESSION: Findings most consistent with infectious or inflammatory colitis of the mid to distal transverse and proximal descending colon. No CT evidence of ischemia. Small volume of free pelvic fluid is likely secondary to colitis. Atherosclerosis. Electronically Signed   By: Inge Rise M.D.   On: 12/03/2019 17:00   DG Chest Port 1 View  Result Date: 12/05/2019 CLINICAL DATA:  Short of breath EXAM: PORTABLE CHEST 1 VIEW COMPARISON:  2011 FINDINGS: Increased interstitial prominence and patchy opacities. No pleural effusion. Top normal heart size. No pneumothorax. Calcified plaque along the thoracic aorta. IMPRESSION: Interstitial prominence and patchy opacities favored to reflect edema. Electronically Signed   By: Macy Mis M.D.   On: 12/05/2019 08:02      Subjective: Pt c/o fatigue  Discharge Exam: Vitals:   12/07/19 0316 12/07/19 0751  BP: (!) 154/80 (!) 137/98  Pulse: (!) 102 (!) 105  Resp: 20 19  Temp: 97.8 F (36.6 C) (!) 97.5 F (36.4 C)  SpO2: 97% 91%   Vitals:    12/06/19 2030 12/06/19 2051 12/07/19 0316 12/07/19 0751  BP:  (!) 130/94 (!) 154/80 (!) 137/98  Pulse:  (!) 109 (!) 102 (!) 105  Resp:   20 19  Temp:  97.8 F (36.6 C) 97.8 F (36.6 C) (!) 97.5 F (36.4 C)  TempSrc:  Oral Oral Oral  SpO2: 97% 100% 97% 91%  Weight:   48.9 kg   Height:        General: Pt is alert, awake, not in acute distress. Frail appearing. Very hard of hearing Cardiovascular:, S1/S2 +, no rubs, no gallops Respiratory: decreased breath sounds b/l, no wheezing, no rhonchi Abdominal: Soft, NT, ND, bowel sounds + Extremities: no edema, no cyanosis    The results of significant diagnostics from this hospitalization (including imaging, microbiology, ancillary and laboratory) are listed below for reference.     Microbiology: Recent Results (from the past 240 hour(s))  SARS CORONAVIRUS 2 (TAT 6-24 HRS) Nasopharyngeal Nasopharyngeal Swab     Status: None   Collection Time: 12/03/19  7:35 PM   Specimen: Nasopharyngeal Swab  Result Value Ref Range Status   SARS Coronavirus 2 NEGATIVE NEGATIVE Final    Comment: (NOTE) SARS-CoV-2 target nucleic acids are NOT DETECTED. The SARS-CoV-2 RNA is generally detectable in upper and lower respiratory specimens during the acute phase of infection. Negative results do not preclude SARS-CoV-2 infection, do not rule out co-infections with other pathogens, and should not be used as the sole basis for treatment or other patient management decisions. Negative results must be combined with clinical observations, patient history, and epidemiological information. The expected result is Negative. Fact Sheet for Patients: SugarRoll.be Fact Sheet for Healthcare Providers: https://www.woods-mathews.com/ This test is not yet approved or cleared by the [place] FDA and  has been authorized for detection and/or diagnosis of SARS-CoV-2 by FDA under an Emergency Use Authorization (EUA). This EUA  will remain  in effect (meaning this test can be used) for the duration of the COVID-19 declaration under Section 56 4(b)(1) of the Act, 21 U.S.C. section 360bbb-3(b)(1), unless the authorization is terminated or revoked sooner. Performed at [facility], [place] [address], [place], [place] [address]      Labs: BNP (last 3 results) No results for input(s): BNP in the last 8760 hours. Basic  Metabolic Panel: Recent Labs  Lab 12/03/19 1225 12/04/19 0137 12/05/19 0643 12/06/19 0815 12/07/19 0603  NA 136 136 140 138 139  K 4.0 3.8 3.7 3.6 3.3*  CL 99 102 109 103 103  CO2 27 25 23 25 28   GLUCOSE 118* 87 111* 103* 104*  BUN 26* 19 11 13 10   CREATININE 0.93 0.71 0.57 0.69 0.61  CALCIUM 9.4 8.8* 9.2 9.0 8.8*  MG  --  1.6* 2.2 1.7  --   PHOS  --  2.8  --   --   --    Liver Function Tests: Recent Labs  Lab 12/04/19 0137  AST 18  ALT 8  ALKPHOS 52  BILITOT 1.1  PROT 6.2*  ALBUMIN 3.7   No results for input(s): LIPASE, AMYLASE in the last 168 hours. No results for input(s): AMMONIA in the last 168 hours. CBC: Recent Labs  Lab 12/03/19 1726 12/04/19 0137 12/05/19 0643 12/06/19 0815 12/07/19 0603  WBC 9.9 9.0 5.9 7.0 6.5  NEUTROABS  --   --  3.2 3.6 3.2  HGB 9.2* 9.5* 9.9* 9.2* 9.5*  HCT 27.2* 28.9* 30.2* 28.2* 28.6*  MCV 99.6 104.7* 104.9* 104.4* 104.4*  PLT 153 166 157 125* 142*   Cardiac Enzymes: No results for input(s): CKTOTAL, CKMB, CKMBINDEX, TROPONINI in the last 168 hours. BNP: Invalid input(s): POCBNP CBG: No results for input(s): GLUCAP in the last 168 hours. D-Dimer No results for input(s): DDIMER in the last 72 hours. Hgb A1c No results for input(s): HGBA1C in the last 72 hours. Lipid Profile No results for input(s): CHOL, HDL, LDLCALC, TRIG, CHOLHDL, LDLDIRECT in the last 72 hours. Thyroid function studies No results for input(s): TSH, T4TOTAL, T3FREE, THYROIDAB in the last 72 hours.  Invalid input(s): FREET3 Anemia work up Recent Labs     12/05/19 0643  VITAMINB12 379  FOLATE 21.0   Urinalysis    Component Value Date/Time   COLORURINE YELLOW (A) 12/06/2019 2114   APPEARANCEUR CLEAR (A) 12/06/2019 2114   APPEARANCEUR Clear 01/05/2015 1350   LABSPEC 1.006 12/06/2019 2114   LABSPEC 1.023 01/05/2015 1350   PHURINE 6.0 12/06/2019 2114   GLUCOSEU NEGATIVE 12/06/2019 2114   GLUCOSEU 50 mg/dL 01/05/2015 Ebbie Sorenson NEGATIVE 12/06/2019 2114   BILIRUBINUR NEGATIVE 12/06/2019 2114   BILIRUBINUR Negative 01/05/2015 1350   KETONESUR NEGATIVE 12/06/2019 2114   PROTEINUR NEGATIVE 12/06/2019 2114   NITRITE NEGATIVE 12/06/2019 2114   LEUKOCYTESUR TRACE (A) 12/06/2019 2114   LEUKOCYTESUR Negative 01/05/2015 1350   Sepsis Labs Invalid input(s): PROCALCITONIN,  WBC,  LACTICIDVEN Microbiology Recent Results (from the past 240 hour(s))  SARS CORONAVIRUS 2 (TAT 6-24 HRS) Nasopharyngeal Nasopharyngeal Swab     Status: None   Collection Time: 12/03/19  7:35 PM   Specimen: Nasopharyngeal Swab  Result Value Ref Range Status   SARS Coronavirus 2 NEGATIVE NEGATIVE Final    Comment: (NOTE) SARS-CoV-2 target nucleic acids are NOT DETECTED. The SARS-CoV-2 RNA is generally detectable in upper and lower respiratory specimens during the acute phase of infection. Negative results do not preclude SARS-CoV-2 infection, do not rule out co-infections with other pathogens, and should not be used as the sole basis for treatment or other patient management decisions. Negative results must be combined with clinical observations, patient history, and epidemiological information. The expected result is Negative. Fact Sheet for Patients: SugarRoll.be Fact Sheet for Healthcare Providers: https://www.woods-mathews.com/ This test is not yet approved or cleared by the [place] FDA and  has been authorized for  detection and/or diagnosis of SARS-CoV-2 by FDA under an Emergency Use Authorization (EUA). This  EUA will remain  in effect (meaning this test can be used) for the duration of the COVID-19 declaration under Section 56 4(b)(1) of the Act, 21 U.S.C. section 360bbb-3(b)(1), unless the authorization is terminated or revoked sooner. Performed at [facility], [place] [address] [place], [place] [address]      Time coordinating discharge: Over 30 minutes  SIGNED:   Wyvonnia Dusky, MD  [facility] 12/07/2019, 7:00 PM Pager   If 7PM-7AM, please contact night-coverage [email or web address] Password [email or web address]

## 2019-12-07 NOTE — Progress Notes (Signed)
Physical Therapy Treatment Patient Details Name: Rhonda Mcclure MRN: [id number] DOB: 1923-10-28 Today's Date: 12/07/2019    History of Present Illness Pt is a 85 y.o. female presenting to hospital 12/03/19 with diarrhea, weakness, and rectal bleeding.  Pt admitted with acute GI bleed in setting of acute diarrhea d/t acute colitis.  PMH includes CHF, htn, heart valve disorder.    PT Comments    Pt ready for session.  Out of bed with increased time but no assist.  Stood with min a x 1 due to small LOB initially upon standing. She is able to progress gait with RW to door and back with min guard/assist at times to navigate tight spaces with walker.  Requested to use commode upon return.  Left on commode with daughter in room to supervise.  Will call nursing for assist when through.  Unit secretary aware.    Daughter stated pt has a rollator at home and prefers to use that vs RW.  Encouraged +1 assist with mobility.   Follow Up Recommendations  Home health PT;Supervision/Assistance - 24 hour;Supervision for mobility/OOB     Equipment Recommendations       Recommendations for Other Services       Precautions / Restrictions Precautions Precautions: Fall Restrictions Weight Bearing Restrictions: No    Mobility  Bed Mobility Overal bed mobility: Modified Independent                Transfers Overall transfer level: Needs assistance Equipment used: Rolling walker (2 wheeled) Transfers: Sit to/from Stand Sit to Stand: Min guard;Min assist         General transfer comment: One small LOB initially upon standing, recovered with min a.  Ambulation/Gait Ambulation/Gait assistance: Min guard Gait Distance (Feet): 25 Feet Assistive device: Rolling walker (2 wheeled) Gait Pattern/deviations: Step-through pattern;Decreased step length - right;Decreased step length - left;Narrow base of support Gait velocity: decreased       Stairs             Wheelchair Mobility     Modified Rankin (Stroke Patients Only)       Balance Overall balance assessment: Needs assistance Sitting-balance support: No upper extremity supported;Feet supported Sitting balance-Leahy Scale: Good     Standing balance support: Bilateral upper extremity supported Standing balance-Leahy Scale: Poor Standing balance comment: pt requiring at least single UE support for standing balance                            Cognition Arousal/Alertness: Awake/alert Behavior During Therapy: WFL for tasks assessed/performed Overall Cognitive Status: Within Functional Limits for tasks assessed                                        Exercises Other Exercises Other Exercises: to commode after session for attempts at [facility].    General Comments        Pertinent Vitals/Pain Pain Assessment: No/denies pain    Home Living                      Prior Function            PT Goals (current goals can now be found in the care plan section) Progress towards PT goals: Progressing toward goals    Frequency    Min 2X/week      PT Plan Current plan  remains appropriate    Co-evaluation              AM-PAC PT "6 Clicks" Mobility   Outcome Measure  Help needed turning from your back to your side while in a flat bed without using bedrails?: None Help needed moving from lying on your back to sitting on the side of a flat bed without using bedrails?: None Help needed moving to and from a bed to a chair (including a wheelchair)?: A Little Help needed standing up from a chair using your arms (e.g., wheelchair or bedside chair)?: A Little Help needed to walk in hospital room?: A Little Help needed climbing 3-5 steps with a railing? : A Little 6 Click Score: 20    End of Session Equipment Utilized During Treatment: Gait belt Activity Tolerance: Patient tolerated treatment well Patient left: Other (comment);with family/visitor present;with call  bell/phone within reach Nurse Communication: Mobility status       Time: [id number] PT Time Calculation (min) (ACUTE ONLY): 19 min  Charges:  $Gait Training: 8-22 mins                    Chesley Noon, PTA 12/07/19, 11:10 AM

## 2019-12-07 NOTE — TOC Transition Note (Signed)
Transition of Care [facility]) - CM/SW Discharge Note   Patient Details  Name: Rhonda Mcclure MRN: [id number] Date of Birth: 03/28/1923  [facility] CM/SW Contact:  Marshell Garfinkel, RN Phone Number: 12/07/2019, 1:13 PM   Clinical Narrative:     Rubin Payor with [facility] home [facility] notified of patient discharge to home today. O2 tank was delivered Friday. No other RNCM needs.  Final next level of care: [place] Barriers to Discharge: No Barriers Identified   Patient Goals and CMS Choice Patient states their goals for this hospitalization and ongoing recovery are:: To return back home with home health. CMS Medicare.gov Compare Post Acute Care list provided to:: Patient Represenative (must comment) Choice offered to / list presented to : Adult Children  Discharge Placement                       Discharge Plan and Services In-house Referral: Clinical Social Work   Post Acute Care Choice: Home Health          DME Arranged: Oxygen DME Agency: [employer] Date DME Agency Contacted: 12/06/19 Time DME Agency Contacted: 1500 Representative spoke with at DME Agency: [place]: PT, OT, RN [place] Agency: [facility] Date [place]: 12/07/19 Time [place]: 25 Representative spoke with at [place]: Cassie  Social Determinants of Health ([place]) Interventions     Readmission Risk Interventions No flowsheet data found.

## 2020-01-15 ENCOUNTER — Encounter: Payer: Self-pay | Admitting: Gastroenterology

## 2020-01-15 ENCOUNTER — Ambulatory Visit (INDEPENDENT_AMBULATORY_CARE_PROVIDER_SITE_OTHER): Payer: Medicare PPO | Admitting: Gastroenterology

## 2020-01-15 DIAGNOSIS — I739 Peripheral vascular disease, unspecified: Secondary | ICD-10-CM | POA: Insufficient documentation

## 2020-01-15 DIAGNOSIS — K625 Hemorrhage of anus and rectum: Secondary | ICD-10-CM

## 2020-01-15 DIAGNOSIS — E785 Hyperlipidemia, unspecified: Secondary | ICD-10-CM | POA: Diagnosis not present

## 2020-01-15 DIAGNOSIS — K5909 Other constipation: Secondary | ICD-10-CM | POA: Diagnosis not present

## 2020-01-15 DIAGNOSIS — I10 Essential (primary) hypertension: Secondary | ICD-10-CM | POA: Diagnosis not present

## 2020-01-15 DIAGNOSIS — I4891 Unspecified atrial fibrillation: Secondary | ICD-10-CM

## 2020-01-15 NOTE — Progress Notes (Signed)
Sherri Sear, MD [address]  [place]  [place], [place] [address]  Main: [phone]  Fax: [phone]    Gastroenterology Consultation Tele Visit  Referring Provider:     [facility], [place] Physician:  [facility], [place] Primary Gastroenterologist:  Dr. Cephas Darby Reason for Consultation: Rectal bleeding        HPI:   Rhonda Mcclure is a 86 y.o. female referred by Dr. Alison Stalling, [facility]  for consultation & management of rectal bleeding  Virtual Visit via Telephone Note  I connected with [name] on 01/15/20 at  2:00 PM EST by telephone and verified that I am speaking with the correct person using two identifiers.   I discussed the limitations, risks, security and privacy concerns of performing an evaluation and management service by telephone and the availability of in person appointments. I also discussed with the patient that there may be a patient responsible charge related to this service. The patient expressed understanding and agreed to proceed.  Location of the Patient: Home  Location of the provider: Office  Persons participating in the visit: Patient, patient's daughter, patient's nurse and provider  History of Present Illness: Ms. Albarez has history of hypertension, hyperlipidemia, A. fib who was previously on Eliquis, retired Marine scientist, was admitted to [facility] on 12/04/2019 secondary to rectal bleeding, patient underwent CT abdomen and pelvis with contrast which revealed thickening of the mid to distal transverse and proximal descending colon consistent with infectious or inflammatory colitis.  No evidence of space occupying lesions, bowel obstruction and no free air.  She did not have leukocytosis.  She is empirically started on ceftriaxone, metronidazole.  Patient has been hemodynamically stable, she did not have further episodes of bleeding since admission. She tolerated diet well and was discharged home on 1  week course of Cipro and Flagyl.  Anticoagulation was held.  Interval summary Since discharge, patient has been at home and has been doing well.  She has severe constipation for which she used to take MiraLAX.  According to patient's daughter, since her hospital discharge, she has been suffering from severe constipation and she took MiraLAX last week.  She had a hard bowel movement on Sunday followed by bright red blood per rectum in the toilet bowl and on wiping.  She was complaining of mild left lower quadrant discomfort.  She did not have any bowel movement since then and no further episodes of rectal bleeding.  She is tolerating p.o. diet well and not complaining of abdominal pain.  She also had an appointment with her cardiologist since discharge and decision was made not to restart Eliquis.   NSAIDs: None  Antiplts/Anticoagulants/Anti thrombotics: None  GI Procedures: Not within last 10 years  Past Medical History:  Diagnosis Date   Cancer ([place])    CHF (congestive heart failure) ([place])    Heart valve disorder    Hypercholesteremia    Hypertension     No past surgical history on file.  Current Outpatient Medications:    amLODipine (NORVASC) 5 MG tablet, Take 5 mg by mouth daily., Disp: , Rfl:    cholecalciferol (VITAMIN D3) 25 MCG (1000 UT) tablet, Take 1,000 Units by mouth daily., Disp: , Rfl:    enalapril (VASOTEC) 20 MG tablet, Take 20 mg by mouth daily., Disp: , Rfl:    FLOVENT HFA 220 MCG/ACT inhaler, Inhale 2 puffs into the lungs 2 (two) times daily., Disp: , Rfl:  furosemide (LASIX) 20 MG tablet, Take 20 mg by mouth daily., Disp: , Rfl:    Ipratropium-Albuterol (COMBIVENT RESPIMAT) 20-100 MCG/ACT AERS respimat, 1 puff daily as needed. , Disp: , Rfl:    metoprolol tartrate (LOPRESSOR) 50 MG tablet, Take by mouth., Disp: , Rfl:    potassium chloride (MICRO-K) 10 MEQ CR capsule, , Disp: , Rfl:    simvastatin (ZOCOR) 20 MG tablet, Take 20 mg by mouth daily.,  Disp: , Rfl:    No family history on file.   Social History   Tobacco Use   Smoking status: Never Smoker   Smokeless tobacco: Never Used  Substance Use Topics   Alcohol use: No   Drug use: Not on file    Allergies as of 01/15/2020 - Review Complete 01/15/2020  Allergen Reaction Noted   Sulfamethoxazole Swelling 07/23/2014   Morphine and related  01/01/2017   Penicillins Hives 01/01/2017    Imaging Studies: Reviewed  Assessment and Plan:   Rhonda Mcclure is a 86 y.o. female with history of hypertension, hyperlipidemia, A. fib previously on Eliquis, with recent hospital admission for rectal bleeding, CT revealed thickening of the transverse and descending colon.  Patient has history of chronic constipation for which she takes MiraLAX.  Patient's daughter requested a follow-up visit due to recurrent rectal bleeding.  This bleeding episode appeared to be small amount and self-limited, most likely outlet bleeding in setting of constipation.  Advised to restart MiraLAX half cup to 1 cup daily, add fiber supplements such as Benefiber or Metamucil daily, adequate intake of water If bleeding recurs despite treating constipation, will discuss with her about flexible sigmoidoscopy Advised patient's daughter to contact me as needed Of note, patient received COVID-19 vaccine first dose last week and tolerated it well   Follow Up Instructions:   I discussed the assessment and treatment plan with the patient. The patient was provided an opportunity to ask questions and all were answered. The patient agreed with the plan and demonstrated an understanding of the instructions.   The patient was advised to call back or seek an in-person evaluation if the symptoms worsen or if the condition fails to improve as anticipated.  I provided 15 minutes of non-face-to-face time during this encounter.   Follow up as needed   Cephas Darby, MD

## 2020-09-03 ENCOUNTER — Telehealth: Payer: Self-pay | Admitting: Nurse Practitioner

## 2020-09-03 NOTE — Telephone Encounter (Signed)
Spoke with patient's daughter, Lamont Snowball, regarding Palliative services and all questions were answered and she was in agreement with scheduling visit.  I have scheduled an In-person Consult for 105/21 @ 12 Noon.

## 2020-09-04 ENCOUNTER — Telehealth: Payer: Self-pay | Admitting: Nurse Practitioner

## 2020-09-04 NOTE — Telephone Encounter (Signed)
Spoke with daughter to let her know that the Palliative NP could see the patient sooner, 09/07/20 @ 2 PM, and asked her if this would be okay and she was in agreement with this appointment.

## 2020-09-07 ENCOUNTER — Other Ambulatory Visit: Payer: Self-pay | Admitting: Nurse Practitioner

## 2020-09-07 ENCOUNTER — Other Ambulatory Visit: Payer: Self-pay

## 2020-09-07 ENCOUNTER — Encounter: Payer: Self-pay | Admitting: Nurse Practitioner

## 2020-09-07 DIAGNOSIS — I5020 Unspecified systolic (congestive) heart failure: Secondary | ICD-10-CM

## 2020-09-07 DIAGNOSIS — Z515 Encounter for palliative care: Secondary | ICD-10-CM

## 2020-09-07 NOTE — Progress Notes (Signed)
[facility] Palliative Care Consult Note Telephone: [phone]  Fax: [phone]  PATIENT NAME: EMMAMARIE KLUENDER DOB: 02-02-1923 MRN: [id number]  PRIMARY CARE PROVIDER:   [facility], [facility]  REFERRING PROVIDER:  Inc, [facility] [place],  [place] [address]  RESPONSIBLE PARTY:   Lamont Snowball [phone]; [phone]  1. Advance Care Planning; DNR and MOST form completed, placed in vynca. Wishes for No feeding tubes, limited interventions, IVF, antibiotics  2. Goals of Care: Goals include to maximize quality of life and symptom management. Our advance care planning conversation included a discussion about:     The value and importance of advance care planning   Exploration of personal, cultural or spiritual beliefs that might influence medical decisions   Exploration of goals of care in the event of a sudden injury or illness   Identification and preparation of a healthcare agent   Review and updating or creation of an  advance directive document.  3. Palliative care encounter; Palliative care encounter; Palliative medicine team will continue to support patient, patient's family, and medical team. Visit consisted of counseling and education dealing with the complex and emotionally intense issues of symptom management and palliative care in the setting of serious and potentially life-threatening illness  4. f/u once Hospice Physician decide eligible; if eligible will proceed with Hospice, if not remains PC and then f/u visit 4 weeks  I spent 105 minutes providing this consultation,  Start at 2:00pm. More than 50% of the time in this consultation was spent coordinating communication.   HISTORY OF PRESENT ILLNESS:  JALEEYA MCNELLY is a 86 y.o. year old female with multiple medical problems including Congestive heart failure, atrial fibrillation , peripheral vascular disease, moderate tricuspid insufficiency, moderate mitral  insufficiency, mild aortic stenosis, left and circular hypertrophy, hypertension, hypercholesterolemia, history of GI bleed 11/2019, colitis. In person palliative care visit for Ms. Coiro. Santiago Glad her daughter and I talked about purpose of palliative care visit. Santiago Glad is [place] in agreement. 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 She is widowed for the last 50 years and has two boys and two girls. All of her adult children live around her. Santiago Glad endorses slow changes in the Last 5 Years for which they have had the same caregiver to come and stay with her. Over the last six months since hospitalization for colitis. Santiago Glad endorses she continued to overall decline. The last few months she has been able to stand up, walk with her walker. Slowly declined to there are days when her legs do not work at all. Santiago Glad endorses they have to lift her up out of the chair to a standing position to shuffle her feet to take a few steps. Santiago Glad endorses her caregiver has been bathing and dressing her for the last month. Ms. Devin has been incontinent of bowel and bladder. Santiago Glad endorses Ms. Lichtenwalner has been refusing to take a bath, lucky to get her bathed once a week. Santiago Glad endorses Ms. Naeve does feed herself but it takes her more than 45 minutes to eat. Santiago Glad endorses Ms. Patton takes small bites and less than 25% of her meals. Santiago Glad endorses it does appear that she I was weighing about 126lbs and lasts Cardiology visit was 106lbs. Santiago Glad endorses Ms. Meiser has lost around 20 lb. Santiago Glad endorses Ms. Luft has become more  irritable and difficult, argumentative at times. Santiago Glad endorses it is very difficult to get her to do things although if you give her some time and come back she forgot what she was mad about. Santiago Glad endorses Ms. Shumard has been sleeping around 20 hours a day. Ms. Giaimo does not want to  get out of bed. Some days her legs do work but other she is in bed for several days. We talked about overall decline and progression of chronic disease in the setting of natural aging. We talked about medical goals of including aggressive versus conservative versus comfort care. We talked about CPR Santiago Glad endorses they would not want for her to have CPR. [place] form completed. MOST form completed with limited interventions, no feeding tube but wishes for IV fluids and antibiotics. MOST and DNR placed in Vynca. We talked about is it with Ms. Arlis Porta. Ms. Koelzer was irritable and agitated during visit. Ms. Abaya was resistant do assessment but did allow auscultation. Ms. Ferryman was limited with verbal responses. Santiago Glad and I talked about Ms. Templeman has been talking about people that have passed on like they have been in present time. We talked about option of Hospice Services Under Medicare benefit. Santiago Glad endorses they did have Hospice Services for her father at home. Ms. Hainsworth was open and accepting of that. We talked about Hospice Physicians to review case and Santiago Glad in agreement. Discuss with Santiago Glad once Hospice Physician's determine eligibility will re-contact with information to either decide to pursue Hospice or remained with Palliative care, then schedule follow-up visit at that time. Santiago Glad in agreement. Questions answered to satisfaction. Contact information.  Palliative Care was asked to help address goals of care.   CODE STATUS: DNR  PPS: 40% HOSPICE ELIGIBILITY/DIAGNOSIS: TBD  PAST MEDICAL HISTORY:  Past Medical History:  Diagnosis Date   Cancer ([place])    CHF (congestive heart failure) ([place])    Colitis 12/03/2019   Diarrhea 12/03/2019   GI bleed 12/03/2019   Heart valve disorder    Hypercholesteremia    Hypertension     SOCIAL HX:  Social History   Tobacco Use   Smoking status: Never Smoker   Smokeless tobacco: Never Used  Substance Use Topics   Alcohol use: No     ALLERGIES:  Allergies  Allergen Reactions   Sulfamethoxazole Swelling    Pt had thick tongue and felt disoriented Pt had thick tongue and felt disoriented    Morphine And Related    Penicillins Hives    Per patient, total body hives. No SOB noted. Young adulthood. Per daughter, who reported that at some point her mother was given a combination form of PCN and did okay with it. However, the daughter is unable to remember the name of the medication.      PERTINENT MEDICATIONS:  Outpatient Encounter Medications as of 09/07/2020  Medication Sig   amLODipine (NORVASC) 5 MG tablet Take 5 mg by mouth daily.   cholecalciferol (VITAMIN D3) 25 MCG (1000 UT) tablet Take 1,000 Units by mouth daily.   enalapril (VASOTEC) 20 MG tablet Take 20 mg by mouth daily.   FLOVENT HFA 220 MCG/ACT inhaler Inhale 2 puffs into the lungs 2 (two) times daily.   furosemide (LASIX) 20 MG tablet Take 20 mg by mouth daily.   Ipratropium-Albuterol (COMBIVENT RESPIMAT) 20-100 MCG/ACT AERS respimat 1 puff daily as needed.    metoprolol tartrate (LOPRESSOR) 50 MG tablet Take by mouth.   potassium chloride (MICRO-K) 10 MEQ CR capsule  simvastatin (ZOCOR) 20 MG tablet Take 20 mg by mouth daily.   No facility-administered encounter medications on file as of 09/07/2020.    PHYSICAL EXAM:   General: debilitated, chronically ill, frail appearing, thin irritable female Cardiovascular: regular rate and rhythm Pulmonary: clear ant fields Extremities: no edema, no joint deformities; muscle wasting; atrophy Neurological: steps unsteady with walker  Chauncey Sciulli Ihor Gully, NP

## 2020-09-22 ENCOUNTER — Other Ambulatory Visit: Payer: Self-pay | Admitting: Nurse Practitioner

## 2020-10-19 DEATH — deceased

## 2021-10-31 IMAGING — CT CT ABD-PELV W/ CM
2 of 5 series · 16 of 46 positions shown, 18 images · IV contrast (APPLIED)
Comparison: CT abdomen and pelvis 01/05/2015.

CLINICAL DATA: Diarrhea for 3 days. Bright red blood per rectum
today.

EXAM:
CT ABDOMEN AND PELVIS WITH CONTRAST
TECHNIQUE: Multidetector CT imaging of the abdomen and pelvis was performed
using the standard protocol following bolus administration of
intravenous contrast.
CONTRAST:  100 mL OMNIPAQUE IOHEXOL 300 MG/ML  SOLN

[Series 2: routine abd/pel with · axial · 0.68mm/px · z∈[-201,+129]mm · 13 of 74 slices shown, 15 images]
[im 4/74  soft-tissue]
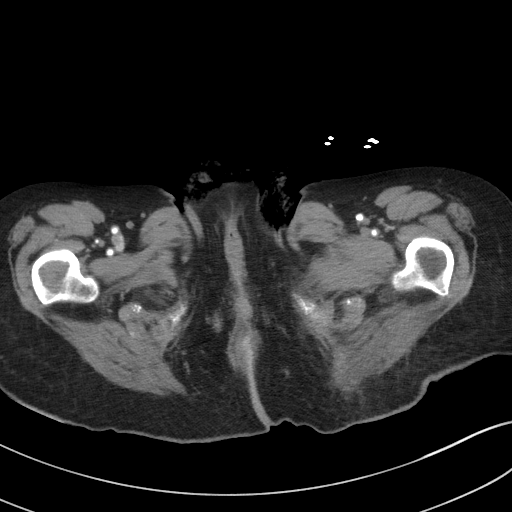
[im 4/74  bone]
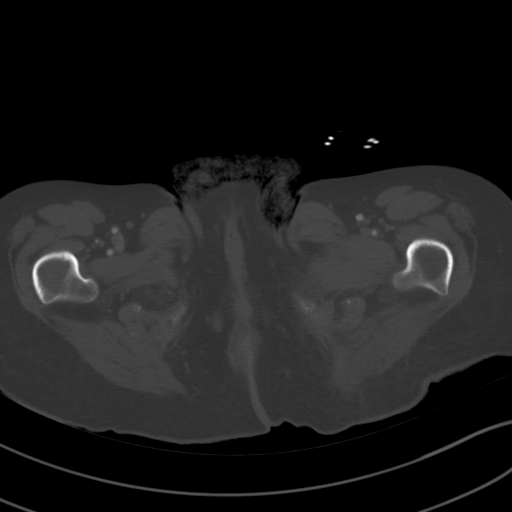
[im 12/74  soft-tissue]
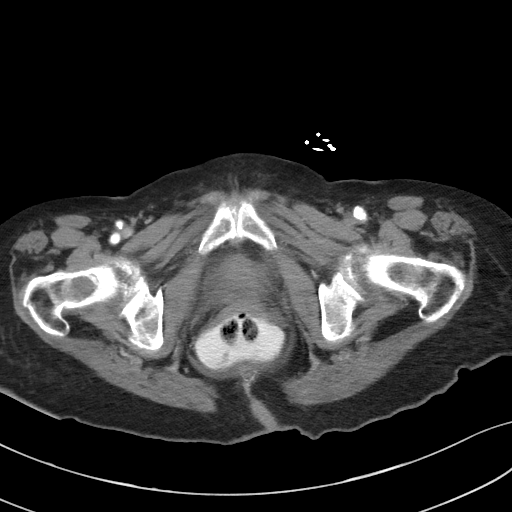
[im 16/74  soft-tissue]
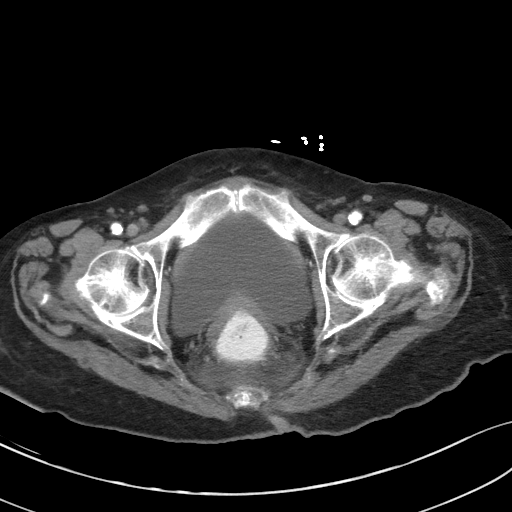
[im 20/74  soft-tissue]
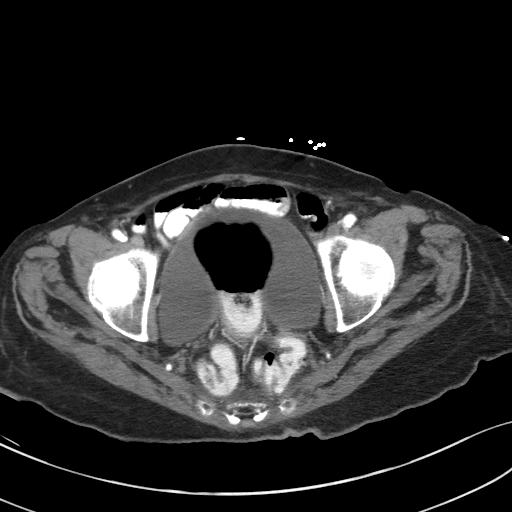
[im 27/74  soft-tissue]
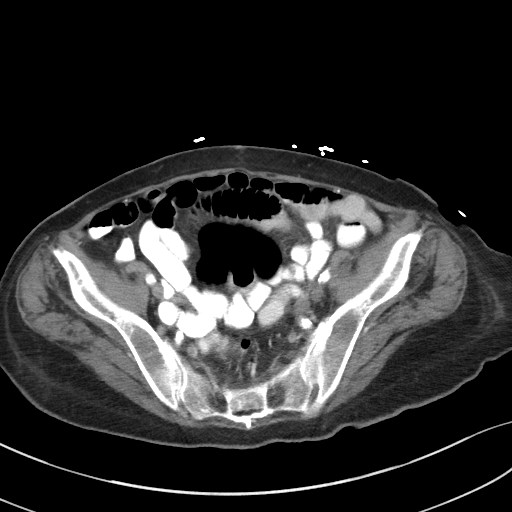
[im 31/74  soft-tissue]
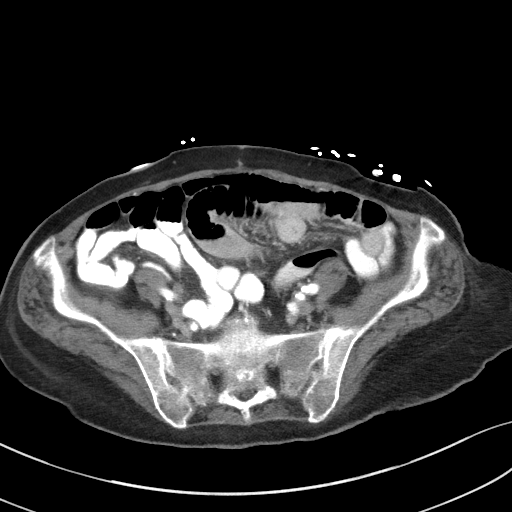
[im 39/74  soft-tissue]
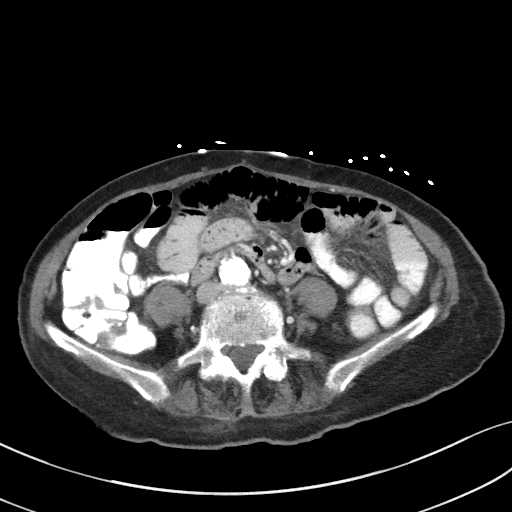
[im 43/74  soft-tissue]
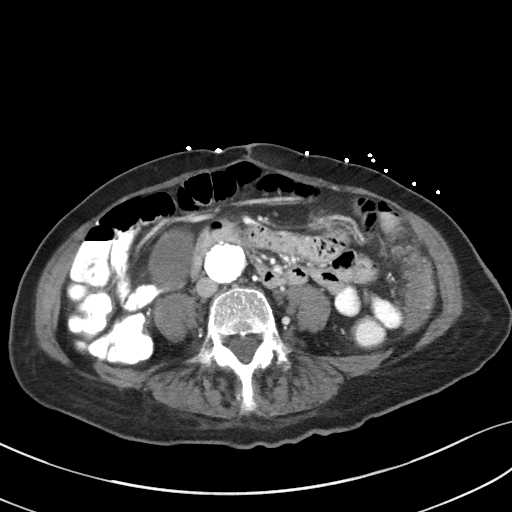
[im 47/74  soft-tissue]
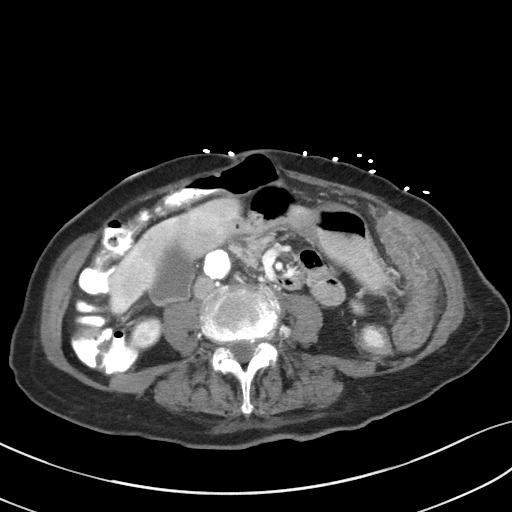
[im 47/74  bone]
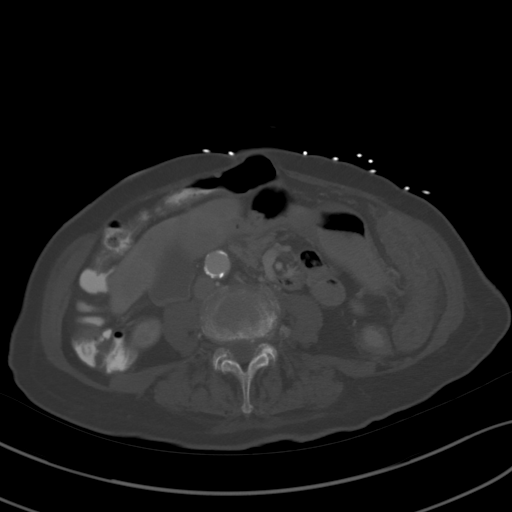
[im 54/74  soft-tissue]
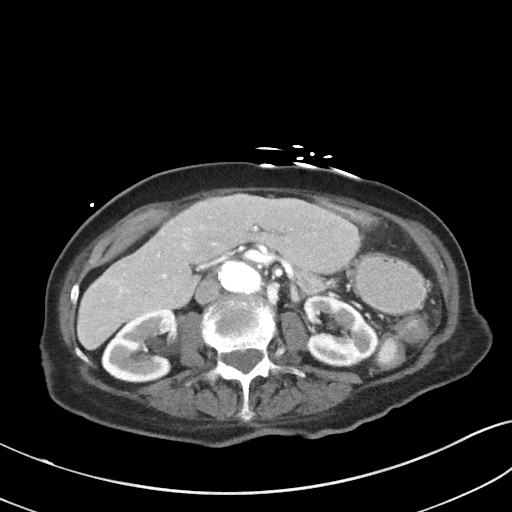
[im 58/74  soft-tissue]
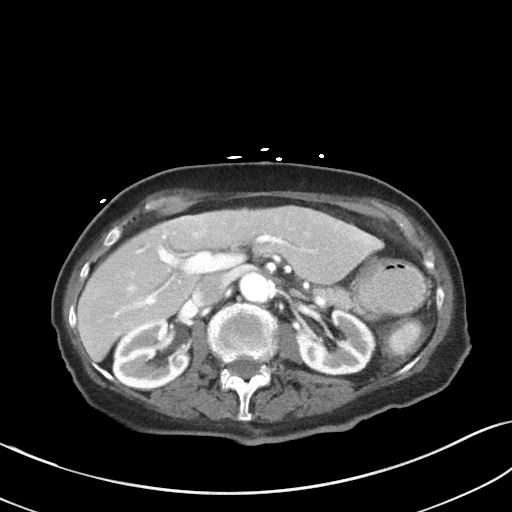
[im 62/74  soft-tissue]
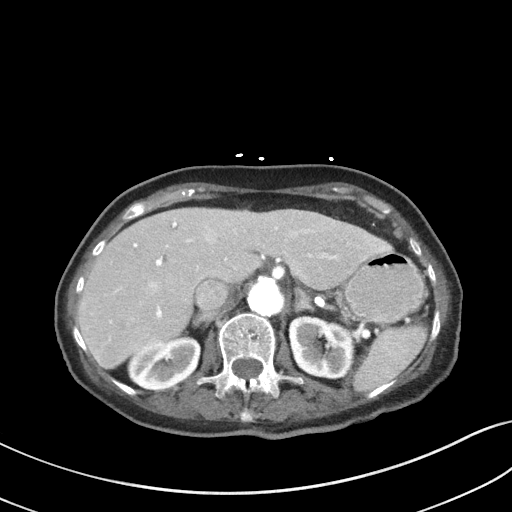
[im 70/74  soft-tissue]
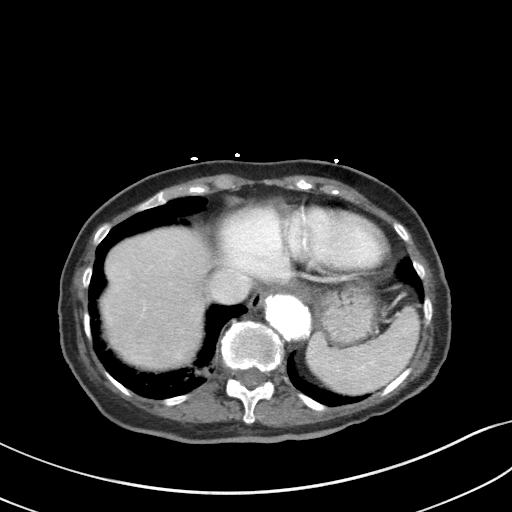

[Series 5: coronal st · coronal · 0.65mm/px · 3 of 70 slices shown]
[im 24/70  soft-tissue]
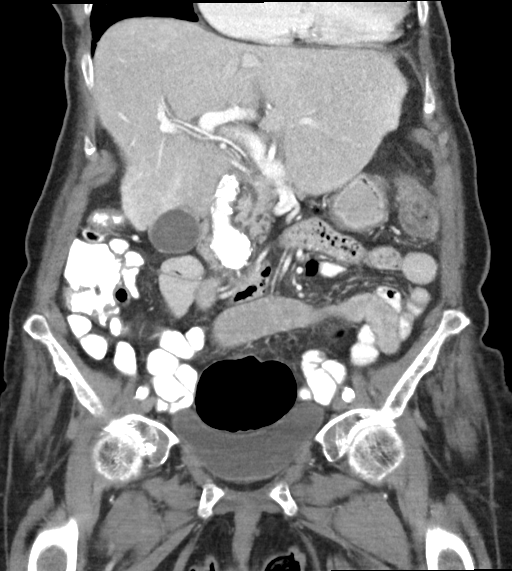
[im 31/70  soft-tissue]
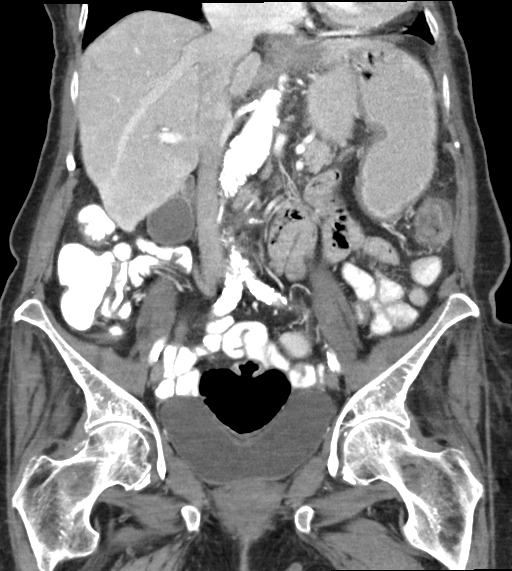
[im 39/70  soft-tissue]
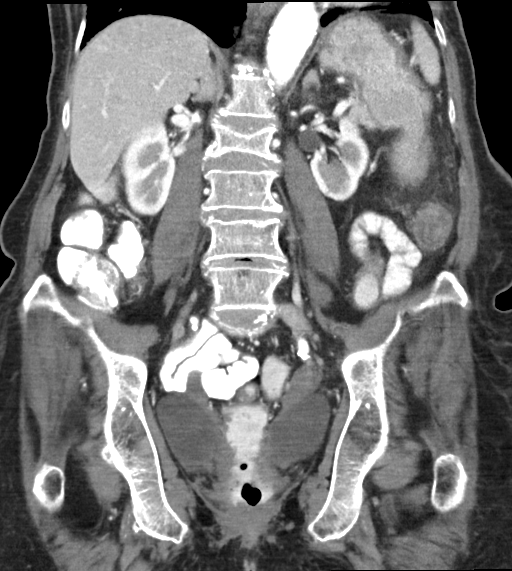

[16 of 46 positions shown; findings below may reference images not displayed]

FINDINGS: Lower chest: Lung bases are emphysematous with basilar atelectasis.
There is cardiomegaly. No pleural or pericardial effusion.

Hepatobiliary: A few small stones are seen layering dependently in
the gallbladder. No evidence of cholecystitis. No focal liver
lesion. Biliary tree unremarkable.

Pancreas: Unremarkable. No pancreatic ductal dilatation or
surrounding inflammatory changes.

Spleen: Normal in size without focal abnormality.

Adrenals/Urinary Tract: Adrenal glands are unremarkable. Kidneys are
normal, without renal calculi, solid lesion, or hydronephrosis.
Bladder is unremarkable. Small renal cysts noted.

Stomach/Bowel: There is thickening of the walls of the mid to distal
transverse and proximal descending colon consistent with infectious
or inflammatory colitis. No pneumatosis, portal venous gas or free
air. No bowel obstruction. The stomach and small bowel are
unremarkable. No evidence of appendicitis.

Vascular/Lymphatic: Aortic atherosclerosis. No enlarged abdominal or
pelvic lymph nodes.

Reproductive: Status post hysterectomy. No adnexal masses.

Other: Small volume of free pelvic fluid noted.

Musculoskeletal: Biconcave compression fracture of L2 is new since
the prior examination but appears remote. No acute or focal
abnormality is identified.
IMPRESSION: Findings most consistent with infectious or inflammatory colitis of
the mid to distal transverse and proximal descending colon. No CT
evidence of ischemia. Small volume of free pelvic fluid is likely
secondary to colitis.

Atherosclerosis.
